# Patient Record
Sex: Female | Born: 1980 | Race: Black or African American | Hispanic: No | Marital: Married | Smoking: Never smoker
Health system: Southern US, Community
[De-identification: ages and names within clinical notes are randomized; demographics above are authoritative.]

## PROBLEM LIST (undated history)

## (undated) DIAGNOSIS — T300 Burn of unspecified body region, unspecified degree: Secondary | ICD-10-CM

## (undated) DIAGNOSIS — T8859XA Other complications of anesthesia, initial encounter: Secondary | ICD-10-CM

## (undated) DIAGNOSIS — O24419 Gestational diabetes mellitus in pregnancy, unspecified control: Secondary | ICD-10-CM

## (undated) DIAGNOSIS — F32A Depression, unspecified: Secondary | ICD-10-CM

## (undated) DIAGNOSIS — E119 Type 2 diabetes mellitus without complications: Secondary | ICD-10-CM

## (undated) DIAGNOSIS — I10 Essential (primary) hypertension: Secondary | ICD-10-CM

## (undated) HISTORY — PX: CERVICAL CERCLAGE: SHX1329

---

## 2017-02-02 ENCOUNTER — Other Ambulatory Visit: Payer: Self-pay

## 2017-02-02 ENCOUNTER — Encounter (HOSPITAL_BASED_OUTPATIENT_CLINIC_OR_DEPARTMENT_OTHER): Payer: Self-pay | Admitting: *Deleted

## 2017-02-02 ENCOUNTER — Emergency Department (HOSPITAL_BASED_OUTPATIENT_CLINIC_OR_DEPARTMENT_OTHER)
Admission: EM | Admit: 2017-02-02 | Discharge: 2017-02-02 | Disposition: A | Discharge status: Home / Self Care | Payer: Managed Care, Other (non HMO) | Attending: Emergency Medicine | Admitting: Emergency Medicine

## 2017-02-02 DIAGNOSIS — H6122 Impacted cerumen, left ear: Secondary | ICD-10-CM | POA: Insufficient documentation

## 2017-02-02 DIAGNOSIS — J069 Acute upper respiratory infection, unspecified: Secondary | ICD-10-CM | POA: Insufficient documentation

## 2017-02-02 DIAGNOSIS — R05 Cough: Secondary | ICD-10-CM | POA: Insufficient documentation

## 2017-02-02 DIAGNOSIS — R531 Weakness: Secondary | ICD-10-CM | POA: Insufficient documentation

## 2017-02-02 DIAGNOSIS — J029 Acute pharyngitis, unspecified: Secondary | ICD-10-CM | POA: Diagnosis present

## 2017-02-02 LAB — RAPID STREP SCREEN (MED CTR MEBANE ONLY): Streptococcus, Group A Screen (Direct): NEGATIVE

## 2017-02-02 MED ORDER — DOCUSATE SODIUM 50 MG/5ML PO LIQD
50.0000 mg | Freq: Once | ORAL | Status: AC
  Administered 2017-02-02: 50 mg via ORAL
  Filled 2017-02-02: qty 10

## 2017-02-02 NOTE — ED Triage Notes (Signed)
Pt has a sore throat and body weakness.

## 2017-02-02 NOTE — ED Provider Notes (Signed)
[facility] EMERGENCY DEPARTMENT Provider Note   CSN: [id number] Arrival date & time: 02/02/17  0827     History   Chief Complaint Chief Complaint  Patient presents with  . Weakness  . Sore Throat    HPI Rita Joseph is a 37 y.o. female.  Patient is a 37 year old female with no significant medical history presenting with 2 weeks of persistent sore throat, cough and drainage.  Patient states symptoms started 2 weeks ago and were much worse than they are now however she continues to have sore throat, minimal cough with occasional sputum and left ear pain.  Patient denies fever, vomiting or abdominal pain.  She denies any shortness of breath.  She does not have a history of asthma and is not a smoker.  She does not have facial pain or significant congestion.   The history is provided by the patient.    History reviewed. No pertinent past medical history.  There are no active problems to display for this patient.   History reviewed. No pertinent surgical history.  OB History    Gravida Para Term Preterm AB Living   3         2   SAB TAB Ectopic Multiple Live Births           3       Home Medications    Prior to Admission medications   Not on File    Family History History reviewed. No pertinent family history.  Social History Social History   Tobacco Use  . Smoking status: Never Smoker  . Smokeless tobacco: Never Used  Substance Use Topics  . Alcohol use: No    Frequency: Never  . Drug use: No     Allergies   Patient has no known allergies.   Review of Systems Review of Systems  All other systems reviewed and are negative.    Physical Exam Updated Vital Signs BP (!) 148/110 (BP Location: Left Arm)   Pulse 86   Temp 98.3 F (36.8 C) (Oral)   Resp 18   Ht 5\' 4"  (1.626 m)   Wt 90.7 kg (200 lb)   SpO2 98%   BMI 34.33 kg/m   Physical Exam  Constitutional: She is oriented to person, place, and time. She appears well-developed  and well-nourished. No distress.  HENT:  Head: Normocephalic and atraumatic.  Right Ear: Tympanic membrane normal.  Left Ear: Tympanic membrane normal.  Mouth/Throat: Oropharynx is clear and moist and mucous membranes are normal. No uvula swelling. No oropharyngeal exudate or posterior oropharyngeal edema. No tonsillar exudate.  Initial left ear was impacted by cerumen but once removed TM is clear.  Minimal petechia over the palate  Eyes: Conjunctivae and EOM are normal. Pupils are equal, round, and reactive to light.  Neck: Normal range of motion. Neck supple.  Cardiovascular: Normal rate, regular rhythm and intact distal pulses.  No murmur heard. Pulmonary/Chest: Effort normal and breath sounds normal. No respiratory distress. She has no wheezes. She has no rales.  Musculoskeletal: Normal range of motion. She exhibits no edema or tenderness.  Neurological: She is alert and oriented to person, place, and time.  Skin: Skin is warm and dry. No rash noted. No erythema.  Psychiatric: She has a normal mood and affect. Her behavior is normal.  Nursing note and vitals reviewed.    ED Treatments / Results  Labs (all labs ordered are listed, but only abnormal results are displayed) Labs Reviewed  RAPID STREP SCREEN (  NOT AT [facility])  CULTURE, GROUP A STREP [facility])    EKG  EKG Interpretation None       Radiology No results found.  Procedures Procedures (including critical care time)  Medications Ordered in ED Medications  docusate (COLACE) 50 MG/5ML liquid 50 mg (50 mg Oral Given 02/02/17 0917)     Initial Impression / Assessment and Plan / ED Course  I have reviewed the triage vital signs and the nursing notes.  Pertinent labs & imaging results that were available during my care of the patient were reviewed by me and considered in my medical decision making (see chart for details).     Patient with persistent URI symptoms which are most likely viral in nature.  Patient displays  no signs concerning for pharyngitis at this time.  Vital signs are reassuring and patient is well-appearing.  Lungs are clear without concern for pneumonia.  Patient reassured and discharged home.  Recommended increasing fluids and rest.  Final Clinical Impressions(s) / ED Diagnoses   Final diagnoses:  Viral upper respiratory tract infection    ED Discharge Orders    None       Gwyneth SproutPlunkett, Anvith Mauriello, MD 02/02/17 1032

## 2017-02-04 LAB — CULTURE, GROUP A STREP (THRC)

## 2018-09-23 ENCOUNTER — Emergency Department (HOSPITAL_BASED_OUTPATIENT_CLINIC_OR_DEPARTMENT_OTHER)
Admission: EM | Admit: 2018-09-23 | Discharge: 2018-09-23 | Disposition: A | Discharge status: Home / Self Care | Payer: Self-pay | Attending: Emergency Medicine | Admitting: Emergency Medicine

## 2018-09-23 ENCOUNTER — Other Ambulatory Visit: Payer: Self-pay

## 2018-09-23 ENCOUNTER — Emergency Department (HOSPITAL_BASED_OUTPATIENT_CLINIC_OR_DEPARTMENT_OTHER): Payer: Self-pay

## 2018-09-23 ENCOUNTER — Encounter (HOSPITAL_BASED_OUTPATIENT_CLINIC_OR_DEPARTMENT_OTHER): Payer: Self-pay | Admitting: *Deleted

## 2018-09-23 DIAGNOSIS — I10 Essential (primary) hypertension: Secondary | ICD-10-CM | POA: Insufficient documentation

## 2018-09-23 DIAGNOSIS — R519 Headache, unspecified: Secondary | ICD-10-CM

## 2018-09-23 DIAGNOSIS — R51 Headache: Secondary | ICD-10-CM | POA: Insufficient documentation

## 2018-09-23 LAB — CBC WITH DIFFERENTIAL/PLATELET
Abs Immature Granulocytes: 0.01 10*3/uL (ref 0.00–0.07)
Basophils Absolute: 0.1 10*3/uL (ref 0.0–0.1)
Basophils Relative: 1 %
Eosinophils Absolute: 0.2 10*3/uL (ref 0.0–0.5)
Eosinophils Relative: 3 %
HCT: 43.2 % (ref 36.0–46.0)
Hemoglobin: 14.2 g/dL (ref 12.0–15.0)
Immature Granulocytes: 0 %
Lymphocytes Relative: 32 %
Lymphs Abs: 2.4 10*3/uL (ref 0.7–4.0)
MCH: 30.6 pg (ref 26.0–34.0)
MCHC: 32.9 g/dL (ref 30.0–36.0)
MCV: 93.1 fL (ref 80.0–100.0)
Monocytes Absolute: 0.7 10*3/uL (ref 0.1–1.0)
Monocytes Relative: 9 %
Neutro Abs: 4.4 10*3/uL (ref 1.7–7.7)
Neutrophils Relative %: 55 %
Platelets: 277 10*3/uL (ref 150–400)
RBC: 4.64 MIL/uL (ref 3.87–5.11)
RDW: 12.4 % (ref 11.5–15.5)
WBC: 7.7 10*3/uL (ref 4.0–10.5)
nRBC: 0 % (ref 0.0–0.2)

## 2018-09-23 LAB — BASIC METABOLIC PANEL
Anion gap: 9 (ref 5–15)
BUN: 9 mg/dL (ref 6–20)
CO2: 25 mmol/L (ref 22–32)
Calcium: 9.3 mg/dL (ref 8.9–10.3)
Chloride: 102 mmol/L (ref 98–111)
Creatinine, Ser: 0.75 mg/dL (ref 0.44–1.00)
GFR calc Af Amer: >60 mL/min (ref >60)
GFR calc non Af Amer: >60 mL/min (ref >60)
Glucose, Bld: 103 mg/dL — ABNORMAL HIGH (ref 70–99)
Potassium: 3.8 mmol/L (ref 3.5–5.1)
Sodium: 136 mmol/L (ref 135–145)

## 2018-09-23 LAB — HCG, SERUM, QUALITATIVE: Preg, Serum: NEGATIVE

## 2018-09-23 MED ORDER — METOCLOPRAMIDE HCL 5 MG/ML IJ SOLN: 10.0000 mg | Freq: Once | INTRAMUSCULAR | Status: DC

## 2018-09-23 MED ORDER — ACETAMINOPHEN 325 MG PO TABS
650.0000 mg | ORAL_TABLET | Freq: Once | ORAL | Status: DC
Start: 2018-09-23 — End: 2018-09-23

## 2018-09-23 MED ORDER — HYDROCHLOROTHIAZIDE 12.5 MG PO TABS: 12.5000 mg | ORAL_TABLET | Freq: Every day | ORAL | 0 refills | Status: DC

## 2018-09-23 MED ORDER — ACETAMINOPHEN 500 MG PO TABS
1000.0000 mg | ORAL_TABLET | Freq: Once | ORAL | Status: AC
  Administered 2018-09-23: 1000 mg via ORAL
  Filled 2018-09-23: qty 2

## 2018-09-23 MED ORDER — SODIUM CHLORIDE 0.9 % IV BOLUS
250.0000 mL | Freq: Once | INTRAVENOUS | Status: AC
  Administered 2018-09-23: 22:00:00 250 mL via INTRAVENOUS

## 2018-09-23 NOTE — ED Provider Notes (Signed)
[facility] EMERGENCY DEPARTMENT Provider Note   CSN: [id number] Arrival date & time: 09/23/18  2006     History   Chief Complaint No chief complaint on file.   HPI Rita Joseph is a 38 y.o. female with no known PMH presents to the ER for evaluation of headache.  Onset for at least 6 months.  Initially attributed it to being stressed, being busy from her children.  She alternates ibuprofen and Tylenol with some temporary relief in her headache.  Initially was not concerned about the headache but 2 weeks ago went on vacation with her family august 9-15, and during that time he noticed that headache was daily and persistent and worse.  Her father has history of hypertension and checked her blood pressure and it was 220/120.  She continued to check her blood pressure that week, every day and her blood pressure did not change.  She has not checked her blood pressure since but on arrival it is 170/110.  Her headache today began gradually and slowly worsened.  It is described as "not that bad", generalized, throbbing.  No interventions for this today.  She denies any associated vision changes or loss, difficulty with swallowing or speech or balance, unilateral weakness or loss of sensation.  She has no associated chest pain, shortness of breath, orthopnea, severe abdominal or back pain.  Reports for a long time she has had intermittent bilateral, symmetric lower extremity swelling that is usually better in the morning and worsens at the end of the day.  She does stand for prolonged period of time at work.  She currently does not think her legs are swollen.  No fever, neck pain or stiffness     HPI  History reviewed. No pertinent past medical history.  There are no active problems to display for this patient.   History reviewed. No pertinent surgical history.   OB History    Gravida  3   Para      Term      Preterm      AB      Living  2     SAB      TAB      Ectopic      Multiple      Live Births  3            Home Medications    Prior to Admission medications   Medication Sig Start Date End Date Taking? Authorizing Provider  hydrochlorothiazide (HYDRODIURIL) 12.5 MG tablet Take 1 tablet (12.5 mg total) by mouth daily. 09/23/18 10/23/18  Liberty Handy, PA-C    Family History No family history on file.  Social History Social History   Tobacco Use  . Smoking status: Never Smoker  . Smokeless tobacco: Never Used  Substance Use Topics  . Alcohol use: No    Frequency: Never  . Drug use: No     Allergies   Patient has no known allergies.   Review of Systems Review of Systems  Neurological: Positive for headaches.  All other systems reviewed and are negative.    Physical Exam Updated Vital Signs BP (!) 158/105   Pulse 64   Temp 98.7 F (37.1 C) (Oral)   Resp 18   Ht 5\' 4"  (1.626 m)   Wt 89.8 kg   LMP 09/09/2018   SpO2 100%   BMI 33.99 kg/m   Physical Exam Vitals signs and nursing note reviewed.  Constitutional:      General:  She is not in acute distress.    Appearance: She is well-developed.     Comments: NAD.  HENT:     Head: Normocephalic and atraumatic.     Comments: No temporal tenderness    Right Ear: External ear normal.     Left Ear: External ear normal.     Nose: Nose normal.  Eyes:     General: No scleral icterus.    Conjunctiva/sclera: Conjunctivae normal.  Neck:     Musculoskeletal: Normal range of motion and neck supple.  Cardiovascular:     Rate and Rhythm: Normal rate and regular rhythm.     Heart sounds: Normal heart sounds.     Comments: No 1+ DP and radial pulses bilaterally.  No lower extremity edema.  No calf tenderness. Pulmonary:     Effort: Pulmonary effort is normal.     Breath sounds: Normal breath sounds.  Musculoskeletal: Normal range of motion.        General: No deformity.  Skin:    General: Skin is warm and dry.     Capillary Refill: Capillary refill takes  less than 2 seconds.  Neurological:     Mental Status: She is alert and oriented to person, place, and time.     Comments:  Alert and oriented to self, place, time and event.  Speech is fluent without dysarthria or dysphasia. Strength 5/5 with hand grip and ankle F/E.   Sensation to light touch intact in face, hands and feet. Normal gait/sits on side of the bed without truncal sway No pronator drift. No leg drop. Normal finger-to-nose and feet tapping.  CN I not tested CN II grossly intact visual fields bilaterally. Unable to visualize posterior eye. CN III, IV, VI PEERL and EOMs intact bilaterally CN V light touch intact in all 3 divisions of trigeminal nerve CN VII facial movements symmetric CN VIII not tested CN IX, X no uvula deviation, symmetric rise of soft palate  CN XI 5/5 SCM and trapezius strength bilaterally  CN XII Midline tongue protrusion, symmetric L/R movements  Psychiatric:        Behavior: Behavior normal.        Thought Content: Thought content normal.        Judgment: Judgment normal.      ED Treatments / Results  Labs (all labs ordered are listed, but only abnormal results are displayed) Labs Reviewed  BASIC METABOLIC PANEL - Abnormal; Notable for the following components:      Result Value   Glucose, Bld 103 (*)    All other components within normal limits  CBC WITH DIFFERENTIAL/PLATELET  HCG, SERUM, QUALITATIVE    EKG None  Radiology Ct Head Wo Contrast  Result Date: 09/23/2018 CLINICAL DATA:  Headache for 1 month EXAM: CT HEAD WITHOUT CONTRAST TECHNIQUE: Contiguous axial images were obtained from the base of the skull through the vertex without intravenous contrast. COMPARISON:  None. FINDINGS: Brain: No evidence of acute infarction, hemorrhage, hydrocephalus, extra-axial collection or mass lesion/mass effect. Vascular: No hyperdense vessel or unexpected calcification. Skull: Normal. Negative for fracture or focal lesion. Sinuses/Orbits: No acute  finding. Other: None IMPRESSION: Negative non contrasted CT appearance of the brain Electronically Signed   By: Donavan Foil M.D.   On: 09/23/2018 21:47    Procedures Procedures (including critical care time)  Medications Ordered in ED Medications  sodium chloride 0.9 % bolus 250 mL (250 mLs Intravenous New Bag/Given 09/23/18 2159)  acetaminophen (TYLENOL) tablet 1,000 mg (1,000 mg Oral Given  09/23/18 2152)     Initial Impression / Assessment and Plan / ED Course  I have reviewed the triage vital signs and the nursing notes.  Pertinent labs & imaging results that were available during my care of the patient were reviewed by me and considered in my medical decision making (see chart for details).  38 year old with no known PMH presents for evaluation of headache in setting of elevated blood pressure.  No previously diagnosed hypertension.  No antihypertensives.  She has strong family history of this.  Her headache has been present for several months.  At home her BPs are ranging from the 200s/100s.  She arrives hypertensive in the 170s over 110s.    Exam is otherwise reassuring.  There is no neuro pulse deficits.  ER work-up reviewed by me.  Essentially normal.  Creatinine is normal.  Head CT is negative for intracranial hemorrhage.  Patient was given Tylenol, IV fluids and her BP improved to 158/105.  Her headache has improved.  Given benign exam, ER work-up I do not think there is further indication for aggressive BP control here for further emergent work-up, admission.  We will start her on antihypertensive.  Recommended PCP follow-up and logging of her BPs to determine if she needs med changes.  Return precautions discussed.  Patient is comfortable with this plan.  Final Clinical Impressions(s) / ED Diagnoses   Final diagnoses:  Elevated blood pressure reading with diagnosis of hypertension  Recurrent headache    ED Discharge Orders         Ordered    hydrochlorothiazide  (HYDRODIURIL) 12.5 MG tablet  Daily     09/23/18 2217           Jerrell MylarGibbons, Khyri Hinzman J, PA-C 09/23/18 2217    Tegeler, Canary Brimhristopher J, MD 09/24/18 [phone]

## 2018-09-23 NOTE — ED Notes (Signed)
Pt. Reports she has had a headache with high b/p and she is here because she does not want to have heart problems or a stroke from this.

## 2018-09-23 NOTE — ED Notes (Signed)
ED Provider at bedside. 

## 2018-09-23 NOTE — Discharge Instructions (Addendum)
°  You were seen in the ER for headache and elevated blood pressure.  Head CT today was normal.  Your lab work was normal.  Your blood pressure improved.  I suspect headaches are from elevated blood pressure.  We will start you on a low-dose blood pressure medicine called hydrochlorothiazide.  This medicine can cause increased urination which will help with fluid buildup.  Take this medication every day.  You can monitor and log your blood pressure readings over the next several days to keep track of blood pressure control.  Follow-up with primary care doctor in 7 to 10 days for reevaluation of your symptoms.  You may need medication adjustment.  Return to the ER for severe sudden headache, vision changes or loss, chest pain, shortness of breath, leg swelling, stroke symptoms.

## 2018-09-23 NOTE — ED Triage Notes (Signed)
Headache for 2 months. She checked her BP 10 days ago and it was 200/118. No hx of HTN.

## 2019-02-05 ENCOUNTER — Other Ambulatory Visit: Payer: Self-pay

## 2019-02-06 ENCOUNTER — Telehealth (INDEPENDENT_AMBULATORY_CARE_PROVIDER_SITE_OTHER): Payer: Self-pay | Admitting: Family Medicine

## 2019-02-06 ENCOUNTER — Encounter: Payer: Self-pay | Admitting: Family Medicine

## 2019-02-06 ENCOUNTER — Ambulatory Visit: Payer: Self-pay | Admitting: Family Medicine

## 2019-02-06 VITALS — BP 137/86 | HR 95 | Ht 64.0 in | Wt 192.0 lb

## 2019-02-06 DIAGNOSIS — I10 Essential (primary) hypertension: Secondary | ICD-10-CM | POA: Insufficient documentation

## 2019-02-06 DIAGNOSIS — Z7689 Persons encountering health services in other specified circumstances: Secondary | ICD-10-CM

## 2019-02-06 MED ORDER — HYDROCHLOROTHIAZIDE 12.5 MG PO TABS: 12.5000 mg | ORAL_TABLET | Freq: Every day | ORAL | 3 refills | Status: DC

## 2019-02-06 NOTE — Progress Notes (Signed)
Virtual Visit via Video Note  I connected with Rita Joseph on 02/06/19 at  9:00 AM EST by a video enabled telemedicine application and verified that I am speaking with the correct person using two identifiers. Location patient: home Location provider: work  Persons participating in the virtual visit: patient, provider  I discussed the limitations of evaluation and management by telemedicine and the availability of in person appointments. The patient expressed understanding and agreed to proceed.  Chief Complaint  Patient presents with  . Establish Care    Pt c/o right foot swelling x 2 weeks,  rt hand tingling and numb when she wakes up.  Pt has not been taking her medication for the past 3 weeks due to insurance issues.  . Hypertension    Pt check her bp once a day.  02/05/19;137/86, HR 102, 02/04/19; 154/96, HR 100, 02/03/19;147/64, HR 103.    HPI: Rita Joseph is a 39 y.o. female new to our office to establish care. She has a h/o HTN and was previously Rx'd HCTZ 12.5mg  daily when seen in the ER in 08/2018 for headache and elevated BP. She has not been taking her HCTZ x 3-4 weeks due to insurance. Home BP logs 147/64, 154/96, 137/86. HR 100-103. These values are off medication. While on medication - 110/74, 114/78, 133/95, 120/78, 126/88, 124/90, 138/98, 126/79. Pt has been trying to drink more water Pt complains of swelling in her Rt foot to her ankle. Not getting worse.  No erythema, warmth.   Fam h/o HTN - father  No past medical history on file.  No past surgical history on file.  No family history on file.  Social History   Tobacco Use  . Smoking status: Never Smoker  . Smokeless tobacco: Never Used  Substance Use Topics  . Alcohol use: No  . Drug use: No     Current Outpatient Medications:  .  hydrochlorothiazide (HYDRODIURIL) 12.5 MG tablet, Take 1 tablet (12.5 mg total) by mouth daily., Disp: 30 tablet, Rfl: 0  No Known Allergies    ROS: See pertinent  positives and negatives per HPI.   EXAM:  VITALS per patient if applicable: BP 062/37 (BP Location: Left Wrist, Patient Position: Sitting, Cuff Size: Small)   Pulse 95   Ht 5\' 4"  (1.626 m)   Wt 192 lb (87.1 kg)   BMI 32.96 kg/m    GENERAL: alert, oriented, appears well and in no acute distress  HEENT: atraumatic, conjunctiva clear, no obvious abnormalities on inspection of external nose and ears  NECK: normal movements of the head and neck  LUNGS: on inspection no signs of respiratory distress, breathing rate appears normal, no obvious gross SOB, gasping or wheezing, no conversational dyspnea  CV: no obvious cyanosis  MS: moves all visible extremities without noticeable abnormality  PSYCH/NEURO: pleasant and cooperative, no obvious depression or anxiety, speech and thought processing grossly intact   Lab Results  Component Value Date   NA 136 09/23/2018   K 3.8 09/23/2018   CL 102 09/23/2018   CO2 25 09/23/2018   Lab Results  Component Value Date   BUN 9 09/23/2018   Lab Results  Component Value Date   CREATININE 0.75 09/23/2018   Lab Results  Component Value Date   WBC 7.7 09/23/2018   HGB 14.2 09/23/2018   HCT 43.2 09/23/2018   MCV 93.1 09/23/2018   PLT 277 09/23/2018     ASSESSMENT AND PLAN: 1. Hypertension, unspecified type - dx in 08/2018, controlled on  HCTZ 12.5mg  daily. Pt has been on med x 4 wks and home BP readings elevated and swelling in fett Refill: - hydrochlorothiazide (HYDRODIURIL) 12.5 MG tablet; Take 1 tablet (12.5 mg total) by mouth daily.  Dispense: 90 tablet; Refill: 3 - check BP 4-5x/wk x 2 wks and send mychart message with these readings - DASH diet - info included in AVS  2. Encounter to establish care with new doctor - due for CPE, fasting labs, PAP - pt will call office to schedule   I discussed the assessment and treatment plan with the patient. The patient was provided an opportunity to ask questions and all were answered. The  patient agreed with the plan and demonstrated an understanding of the instructions.   The patient was advised to call back or seek an in-person evaluation if the symptoms worsen or if the condition fails to improve as anticipated.   Luana Shu, DO

## 2019-02-06 NOTE — Patient Instructions (Addendum)
DASH Eating Plan DASH stands for "Dietary Approaches to Stop Hypertension." The DASH eating plan is a healthy eating plan that has been shown to reduce high blood pressure (hypertension). It may also reduce your risk for type 2 diabetes, heart disease, and stroke. The DASH eating plan may also help with weight loss. What are tips for following this plan?  General guidelines  Avoid eating more than 2,300 mg (milligrams) of salt (sodium) a day. If you have hypertension, you may need to reduce your sodium intake to 1,500 mg a day.  Limit alcohol intake to no more than 1 drink a day for nonpregnant women and 2 drinks a day for men. One drink equals 12 oz of beer, 5 oz of wine, or 1 oz of hard liquor.  Work with your health care provider to maintain a healthy body weight or to lose weight. Ask what an ideal weight is for you.  Get at least 30 minutes of exercise that causes your heart to beat faster (aerobic exercise) most days of the week. Activities may include walking, swimming, or biking.  Work with your health care provider or diet and nutrition specialist (dietitian) to adjust your eating plan to your individual calorie needs. Reading food labels   Check food labels for the amount of sodium per serving. Choose foods with less than 5 percent of the Daily Value of sodium. Generally, foods with less than 300 mg of sodium per serving fit into this eating plan.  To find whole grains, look for the word "whole" as the first word in the ingredient list. Shopping  Buy products labeled as "low-sodium" or "no salt added."  Buy fresh foods. Avoid canned foods and premade or frozen meals. Cooking  Avoid adding salt when cooking. Use salt-free seasonings or herbs instead of table salt or sea salt. Check with your health care provider or pharmacist before using salt substitutes.  Do not fry foods. Cook foods using healthy methods such as baking, boiling, grilling, and broiling instead.  Cook with  heart-healthy oils, such as olive, canola, soybean, or sunflower oil. Meal planning  Eat a balanced diet that includes: ? 5 or more servings of fruits and vegetables each day. At each meal, try to fill half of your plate with fruits and vegetables. ? Up to 6-8 servings of whole grains each day. ? Less than 6 oz of lean meat, poultry, or fish each day. A 3-oz serving of meat is about the same size as a deck of cards. One egg equals 1 oz. ? 2 servings of low-fat dairy each day. ? A serving of nuts, seeds, or beans 5 times each week. ? Heart-healthy fats. Healthy fats called Omega-3 fatty acids are found in foods such as flaxseeds and coldwater fish, like sardines, salmon, and mackerel.  Limit how much you eat of the following: ? Canned or prepackaged foods. ? Food that is high in trans fat, such as fried foods. ? Food that is high in saturated fat, such as fatty meat. ? Sweets, desserts, sugary drinks, and other foods with added sugar. ? Full-fat dairy products.  Do not salt foods before eating.  Try to eat at least 2 vegetarian meals each week.  Eat more home-cooked food and less restaurant, buffet, and fast food.  When eating at a restaurant, ask that your food be prepared with less salt or no salt, if possible. What foods are recommended? The items listed may not be a complete list. Talk with your dietitian about   what dietary choices are best for you. Grains Whole-grain or whole-wheat bread. Whole-grain or whole-wheat pasta. Brown rice. Oatmeal. Quinoa. Bulgur. Whole-grain and low-sodium cereals. Pita bread. Low-fat, low-sodium crackers. Whole-wheat flour tortillas. Vegetables Fresh or frozen vegetables (raw, steamed, roasted, or grilled). Low-sodium or reduced-sodium tomato and vegetable juice. Low-sodium or reduced-sodium tomato sauce and tomato paste. Low-sodium or reduced-sodium canned vegetables. Fruits All fresh, dried, or frozen fruit. Canned fruit in natural juice (without  added sugar). Meat and other protein foods Skinless chicken or turkey. Ground chicken or turkey. Pork with fat trimmed off. Fish and seafood. Egg whites. Dried beans, peas, or lentils. Unsalted nuts, nut butters, and seeds. Unsalted canned beans. Lean cuts of beef with fat trimmed off. Low-sodium, lean deli meat. Dairy Low-fat (1%) or fat-free (skim) milk. Fat-free, low-fat, or reduced-fat cheeses. Nonfat, low-sodium ricotta or cottage cheese. Low-fat or nonfat yogurt. Low-fat, low-sodium cheese. Fats and oils Soft margarine without trans fats. Vegetable oil. Low-fat, reduced-fat, or light mayonnaise and salad dressings (reduced-sodium). Canola, safflower, olive, soybean, and sunflower oils. Avocado. Seasoning and other foods Herbs. Spices. Seasoning mixes without salt. Unsalted popcorn and pretzels. Fat-free sweets. What foods are not recommended? The items listed may not be a complete list. Talk with your dietitian about what dietary choices are best for you. Grains Baked goods made with fat, such as croissants, muffins, or some breads. Dry pasta or rice meal packs. Vegetables Creamed or fried vegetables. Vegetables in a cheese sauce. Regular canned vegetables (not low-sodium or reduced-sodium). Regular canned tomato sauce and paste (not low-sodium or reduced-sodium). Regular tomato and vegetable juice (not low-sodium or reduced-sodium). Pickles. Olives. Fruits Canned fruit in a light or heavy syrup. Fried fruit. Fruit in cream or butter sauce. Meat and other protein foods Fatty cuts of meat. Ribs. Fried meat. Bacon. Sausage. Bologna and other processed lunch meats. Salami. Fatback. Hotdogs. Bratwurst. Salted nuts and seeds. Canned beans with added salt. Canned or smoked fish. Whole eggs or egg yolks. Chicken or turkey with skin. Dairy Whole or 2% milk, cream, and half-and-half. Whole or full-fat cream cheese. Whole-fat or sweetened yogurt. Full-fat cheese. Nondairy creamers. Whipped toppings.  Processed cheese and cheese spreads. Fats and oils Butter. Stick margarine. Lard. Shortening. Ghee. Bacon fat. Tropical oils, such as coconut, palm kernel, or palm oil. Seasoning and other foods Salted popcorn and pretzels. Onion salt, garlic salt, seasoned salt, table salt, and sea salt. Worcestershire sauce. Tartar sauce. Barbecue sauce. Teriyaki sauce. Soy sauce, including reduced-sodium. Steak sauce. Canned and packaged gravies. Fish sauce. Oyster sauce. Cocktail sauce. Horseradish that you find on the shelf. Ketchup. Mustard. Meat flavorings and tenderizers. Bouillon cubes. Hot sauce and Tabasco sauce. Premade or packaged marinades. Premade or packaged taco seasonings. Relishes. Regular salad dressings. Where to find more information:  National Heart, Lung, and Blood Institute: www.nhlbi.nih.gov  American Heart Association: www.heart.org Summary  The DASH eating plan is a healthy eating plan that has been shown to reduce high blood pressure (hypertension). It may also reduce your risk for type 2 diabetes, heart disease, and stroke.  With the DASH eating plan, you should limit salt (sodium) intake to 2,300 mg a day. If you have hypertension, you may need to reduce your sodium intake to 1,500 mg a day.  When on the DASH eating plan, aim to eat more fresh fruits and vegetables, whole grains, lean proteins, low-fat dairy, and heart-healthy fats.  Work with your health care provider or diet and nutrition specialist (dietitian) to adjust your eating plan to your   individual calorie needs. This information is not intended to replace advice given to you by your health care provider. Make sure you discuss any questions you have with your health care provider. Document Revised: 12/28/2016 Document Reviewed: 01/09/2016 Elsevier Patient Education  2020 Elsevier Inc.  

## 2019-02-12 ENCOUNTER — Ambulatory Visit: Payer: Self-pay | Admitting: Family Medicine

## 2019-03-03 ENCOUNTER — Other Ambulatory Visit: Payer: Self-pay

## 2019-03-03 NOTE — Patient Instructions (Addendum)
Health Maintenance Due  Topic Date Due  . HIV Screening  07/07/1996  . TETANUS/TDAP  07/07/2000  . PAP SMEAR-Modifier  07/08/2002  . INFLUENZA VACCINE  08/30/2018    Depression screen PHQ 2/9 02/06/2019  Decreased Interest 0  Down, Depressed, Hopeless 0  PHQ - 2 Score 0   Increase HCTZ to 25mg  (12.5mg  tabs x 2).  [facility]  [email or web address] [address], [place] [address] P: [phone] F: [phone] [email or web address]  [facility] [email or web address] [address], [place], [place] [address] fax: [phone] [email or web address]   Health Maintenance, Female Adopting a healthy lifestyle and getting preventive care are important in promoting health and wellness. Ask your health care provider about:  The right schedule for you to have regular tests and exams.  Things you can do on your own to prevent diseases and keep yourself healthy. What should I know about diet, weight, and exercise? Eat a healthy diet   Eat a diet that includes plenty of vegetables, fruits, low-fat dairy products, and lean protein.  Do not eat a lot of foods that are high in solid fats, added sugars, or sodium. Maintain a healthy weight Body mass index (BMI) is used to identify weight problems. It estimates body fat based on height and weight. Your health care provider can help determine your BMI and help you achieve or maintain a healthy weight. Get regular exercise Get regular exercise. This is one of the most important things you can do for your health. Most adults should:  Exercise for at least 150 minutes each week. The exercise should increase your heart rate and make you sweat (moderate-intensity exercise).  Do strengthening exercises at least twice a week. This is in addition to the moderate-intensity exercise.  Spend less time sitting. Even light physical activity can be beneficial. Watch  cholesterol and blood lipids Have your blood tested for lipids and cholesterol at 38 years of age, then have this test every 5 years. Have your cholesterol levels checked more often if:  Your lipid or cholesterol levels are high.  You are older than 38 years of age.  You are at high risk for heart disease. What should I know about cancer screening? Depending on your health history and family history, you may need to have cancer screening at various ages. This may include screening for:  Breast cancer.  Cervical cancer.  Colorectal cancer.  Skin cancer.  Lung cancer. What should I know about heart disease, diabetes, and high blood pressure? Blood pressure and heart disease  High blood pressure causes heart disease and increases the risk of stroke. This is more likely to develop in people who have high blood pressure readings, are of African descent, or are overweight.  Have your blood pressure checked: ? Every 3-5 years if you are 70-5 years of age. ? Every year if you are 11 years old or older. Diabetes Have regular diabetes screenings. This checks your fasting blood sugar level. Have the screening done:  Once every three years after age 38 if you are at a normal weight and have a low risk for diabetes.  More often and at a younger age if you are overweight or have a high risk for diabetes. What should I know about preventing infection? Hepatitis B If you have a higher risk for hepatitis B, you should be screened for this virus. Talk with your health care provider to find out if you are at risk for hepatitis B  infection. Hepatitis C Testing is recommended for:  Everyone born from [birth year] through 1964.  Anyone with known risk factors for hepatitis C. Sexually transmitted infections (STIs)  Get screened for STIs, including gonorrhea and chlamydia, if: ? You are sexually active and are younger than 38 years of age. ? You are older than 38 years of age and your health care  provider tells you that you are at risk for this type of infection. ? Your sexual activity has changed since you were last screened, and you are at increased risk for chlamydia or gonorrhea. Ask your health care provider if you are at risk.  Ask your health care provider about whether you are at high risk for HIV. Your health care provider may recommend a prescription medicine to help prevent HIV infection. If you choose to take medicine to prevent HIV, you should first get tested for HIV. You should then be tested every 3 months for as long as you are taking the medicine. Pregnancy  If you are about to stop having your period (premenopausal) and you may become pregnant, seek counseling before you get pregnant.  Take 400 to 800 micrograms (mcg) of folic acid every day if you become pregnant.  Ask for birth control (contraception) if you want to prevent pregnancy. Osteoporosis and menopause Osteoporosis is a disease in which the bones lose minerals and strength with aging. This can result in bone fractures. If you are 36 years old or older, or if you are at risk for osteoporosis and fractures, ask your health care provider if you should:  Be screened for bone loss.  Take a calcium or vitamin D supplement to lower your risk of fractures.  Be given hormone replacement therapy (HRT) to treat symptoms of menopause. Follow these instructions at home: Lifestyle  Do not use any products that contain nicotine or tobacco, such as cigarettes, e-cigarettes, and chewing tobacco. If you need help quitting, ask your health care provider.  Do not use street drugs.  Do not share needles.  Ask your health care provider for help if you need support or information about quitting drugs. Alcohol use  Do not drink alcohol if: ? Your health care provider tells you not to drink. ? You are pregnant, may be pregnant, or are planning to become pregnant.  If you drink alcohol: ? Limit how much you use to 0-1  drink a day. ? Limit intake if you are breastfeeding.  Be aware of how much alcohol is in your drink. In the [place] one drink equals one 12 oz bottle of beer (355 mL), one 5 oz glass of wine (148 mL), or one 1 oz glass of hard liquor (44 mL). General instructions  Schedule regular health, dental, and eye exams.  Stay current with your vaccines.  Tell your health care provider if: ? You often feel depressed. ? You have ever been abused or do not feel safe at home. Summary  Adopting a healthy lifestyle and getting preventive care are important in promoting health and wellness.  Follow your health care provider's instructions about healthy diet, exercising, and getting tested or screened for diseases.  Follow your health care provider's instructions on monitoring your cholesterol and blood pressure. This information is not intended to replace advice given to you by your health care provider. Make sure you discuss any questions you have with your health care provider. Document Revised: 01/08/2018 Document Reviewed: 01/08/2018 Elsevier Patient Education  2020 [employer].

## 2019-03-04 ENCOUNTER — Other Ambulatory Visit (HOSPITAL_COMMUNITY)
Admission: RE | Admit: 2019-03-04 | Discharge: 2019-03-04 | Disposition: A | Discharge status: Home / Self Care | Payer: Managed Care, Other (non HMO) | Source: Ambulatory Visit | Attending: Family Medicine | Admitting: Family Medicine

## 2019-03-04 ENCOUNTER — Ambulatory Visit (INDEPENDENT_AMBULATORY_CARE_PROVIDER_SITE_OTHER): Payer: Managed Care, Other (non HMO) | Admitting: Family Medicine

## 2019-03-04 ENCOUNTER — Encounter: Payer: Self-pay | Admitting: Family Medicine

## 2019-03-04 VITALS — BP 140/90 | HR 75 | Temp 97.4°F | Ht 62.5 in | Wt 189.6 lb

## 2019-03-04 DIAGNOSIS — R600 Localized edema: Secondary | ICD-10-CM

## 2019-03-04 DIAGNOSIS — Z2821 Immunization not carried out because of patient refusal: Secondary | ICD-10-CM

## 2019-03-04 DIAGNOSIS — L309 Dermatitis, unspecified: Secondary | ICD-10-CM | POA: Diagnosis not present

## 2019-03-04 DIAGNOSIS — Z6834 Body mass index (BMI) 34.0-34.9, adult: Secondary | ICD-10-CM | POA: Diagnosis not present

## 2019-03-04 DIAGNOSIS — Z0001 Encounter for general adult medical examination with abnormal findings: Secondary | ICD-10-CM | POA: Diagnosis not present

## 2019-03-04 DIAGNOSIS — I10 Essential (primary) hypertension: Secondary | ICD-10-CM

## 2019-03-04 DIAGNOSIS — Z124 Encounter for screening for malignant neoplasm of cervix: Secondary | ICD-10-CM

## 2019-03-04 DIAGNOSIS — Z0000 Encounter for general adult medical examination without abnormal findings: Secondary | ICD-10-CM

## 2019-03-04 LAB — LIPID PANEL
Cholesterol: 173 mg/dL (ref 0–200)
HDL: 50.6 mg/dL (ref >39.00)
LDL Cholesterol: 109 mg/dL — ABNORMAL HIGH (ref 0–99)
NonHDL: 122.33
Total CHOL/HDL Ratio: 3
Triglycerides: 67 mg/dL (ref 0.0–149.0)
VLDL: 13.4 mg/dL (ref 0.0–40.0)

## 2019-03-04 LAB — BASIC METABOLIC PANEL
BUN: 9 mg/dL (ref 6–23)
CO2: 28 mEq/L (ref 19–32)
Calcium: 9.4 mg/dL (ref 8.4–10.5)
Chloride: 103 mEq/L (ref 96–112)
Creatinine, Ser: 0.83 mg/dL (ref 0.40–1.20)
GFR: 93.27 mL/min (ref >60.00)
Glucose, Bld: 81 mg/dL (ref 70–99)
Potassium: 3.7 mEq/L (ref 3.5–5.1)
Sodium: 138 mEq/L (ref 135–145)

## 2019-03-04 LAB — TSH: TSH: 1.06 u[IU]/mL (ref 0.35–4.50)

## 2019-03-04 LAB — CBC
HCT: 42.8 % (ref 36.0–46.0)
Hemoglobin: 14.7 g/dL (ref 12.0–15.0)
MCHC: 34.4 g/dL (ref 30.0–36.0)
MCV: 95.9 fl (ref 78.0–100.0)
Platelets: 243 10*3/uL (ref 150.0–400.0)
RBC: 4.47 Mil/uL (ref 3.87–5.11)
RDW: 12.9 % (ref 11.5–15.5)
WBC: 6.5 10*3/uL (ref 4.0–10.5)

## 2019-03-04 LAB — VITAMIN D 25 HYDROXY (VIT D DEFICIENCY, FRACTURES): VITD: 10.09 ng/mL — ABNORMAL LOW (ref 30.00–100.00)

## 2019-03-04 LAB — AST: AST: 19 U/L (ref 0–37)

## 2019-03-04 LAB — HEMOGLOBIN A1C: Hgb A1c MFr Bld: 5.4 % (ref 4.6–6.5)

## 2019-03-04 LAB — ALT: ALT: 26 U/L (ref 0–35)

## 2019-03-04 MED ORDER — HYDROCHLOROTHIAZIDE 25 MG PO TABS: 25.0000 mg | ORAL_TABLET | Freq: Every day | ORAL | 3 refills | Status: DC

## 2019-03-04 MED ORDER — FUROSEMIDE 20 MG PO TABS: 20.0000 mg | ORAL_TABLET | Freq: Every day | ORAL | 1 refills | Status: DC | PRN

## 2019-03-04 MED ORDER — TRIAMCINOLONE ACETONIDE 0.5 % EX OINT: 1.0000 "application " | TOPICAL_OINTMENT | Freq: Two times a day (BID) | CUTANEOUS | 0 refills | Status: DC

## 2019-03-04 NOTE — Progress Notes (Signed)
Rita Joseph is a 39 y.o. female  Chief Complaint  Patient presents with  . Annual Exam    Pt would like dicuss her sugar.  Rt foot pain and swelling.  Pt is fasting for labs today.. Pt would like for her IUD to be looked at to make sure that it is ok. Pt declines the flu shot today.  Pt is due for a Tdap.  Pt will recieve a Pap Smear today.    HPI: Rita Joseph is a 39 y.o. female here for CPE, fasting labs.   She states she has an IUD in place x 4.5 years or so. She is unsure of exactly which IUD (not paraguard) she has and unsure of when it needs to be replaced. She thinks she may have been told 3 years.   Declines flu and Tdap vaccines today.   She has a h/o HTN and currently taking HCTZ 12.5mg  daily. She states she forgot home BP log but BP readings are "never in the green". She recalls 130-140's/90-101.   Rt foot swelling. Worse at end of the day but still swollen in AM. Started after stopping HCTZ. She notes some discoloration on the top of her foot with patchy areas of dry skin.  No pain at rest or when standing or walking. No numbness or tingling. Does not feel cold.  Dry/rough/doscolored skin itches.  Last PAP: due  She has been trying to lose weight since 12/2018.  She was 215lbs and is down to 189lbs. She has cut out beef and pork, using air fryer.  She just started exercising as well.    History reviewed. No pertinent past medical history.  History reviewed. No pertinent surgical history.  Social History   Socioeconomic History  . Marital status: Married    Spouse name: Not on file  . Number of children: Not on file  . Years of education: Not on file  . Highest education level: Not on file  Occupational History  . Not on file  Tobacco Use  . Smoking status: Never Smoker  . Smokeless tobacco: Never Used  Substance and Sexual Activity  . Alcohol use: No  . Drug use: No  . Sexual activity: Yes    Birth control/protection: I.U.D.  Other Topics  Concern  . Not on file  Social History Narrative  . Not on file   Social Determinants of Health   Financial Resource Strain:   . Difficulty of Paying Living Expenses: Not on file  Food Insecurity:   . Worried About Programme researcher, broadcasting/film/video in the Last Year: Not on file  . Ran Out of Food in the Last Year: Not on file  Transportation Needs:   . Lack of Transportation (Medical): Not on file  . Lack of Transportation (Non-Medical): Not on file  Physical Activity:   . Days of Exercise per Week: Not on file  . Minutes of Exercise per Session: Not on file  Stress:   . Feeling of Stress : Not on file  Social Connections:   . Frequency of Communication with Friends and Family: Not on file  . Frequency of Social Gatherings with Friends and Family: Not on file  . Attends Religious Services: Not on file  . Active Member of Clubs or Organizations: Not on file  . Attends Banker Meetings: Not on file  . Marital Status: Not on file  Intimate Partner Violence:   . Fear of Current or Ex-Partner: Not on file  . Emotionally  Abused: Not on file  . Physically Abused: Not on file  . Sexually Abused: Not on file    History reviewed. No pertinent family history.    There is no immunization history on file for this patient.  Outpatient Encounter Medications as of 03/04/2019  Medication Sig  . hydrochlorothiazide (HYDRODIURIL) 12.5 MG tablet Take 1 tablet (12.5 mg total) by mouth daily.   No facility-administered encounter medications on file as of 03/04/2019.     ROS: Gen: no fever, chills  Skin: no rash, itching ENT: no ear pain, ear drainage, nasal congestion, rhinorrhea, sinus pressure, sore throat Eyes: no blurry vision, double vision Resp: no cough, wheeze,SOB Breast: no breast tenderness, no nipple discharge, no breast masses CV: no CP, palpitations, LE edema,  GI: no heartburn, n/v/d/c, abd pain GU: no dysuria, urgency, frequency, hematuria; no vaginal itching, odor,  discharge MSK: no joint pain, myalgias, back pain Neuro: no dizziness, headache, weakness, vertigo Psych: no depression, anxiety, insomnia   No Known Allergies  BP 140/90 (BP Location: Left Arm, Patient Position: Sitting, Cuff Size: Normal)   Pulse 75   Temp (!) 97.4 F (36.3 C) (Temporal)   Ht 5' 2.5" (1.588 m)   Wt 189 lb 9.6 oz (86 kg)   LMP 02/08/2019 Comment: IUD  SpO2 100%   BMI 34.13 kg/m   BP Readings from Last 3 Encounters:  03/04/19 140/90  02/06/19 137/86  09/23/18 (!) 158/105   Wt Readings from Last 3 Encounters:  03/04/19 189 lb 9.6 oz (86 kg)  02/06/19 192 lb (87.1 kg)  09/23/18 198 lb (89.8 kg)    Physical Exam  Constitutional: She is oriented to person, place, and time. She appears well-developed and well-nourished. No distress.  HENT:  Head: Normocephalic and atraumatic.  Right Ear: Tympanic membrane and ear canal normal.  Left Ear: Tympanic membrane and ear canal normal.  Nose: Nose normal.  Mouth/Throat: Oropharynx is clear and moist and mucous membranes are normal.  Eyes: Pupils are equal, round, and reactive to light. Conjunctivae are normal.  Neck: No thyromegaly present.  Cardiovascular: Normal rate, regular rhythm, normal heart sounds and intact distal pulses.  No murmur heard. Pulmonary/Chest: Effort normal and breath sounds normal. No respiratory distress. She has no wheezes. She has no rhonchi. Right breast exhibits no mass, no nipple discharge, no skin change and no tenderness. Left breast exhibits no mass, no nipple discharge, no skin change and no tenderness.  Abdominal: Soft. Bowel sounds are normal. She exhibits no distension and no mass. There is no abdominal tenderness.  Genitourinary:    Vagina and uterus normal.  There is no rash, tenderness or lesion on the right labia. There is no rash, tenderness or lesion on the left labia. Cervix exhibits no motion tenderness, no discharge and no friability. Right adnexum displays no mass, no  tenderness and no fullness. Left adnexum displays no mass, no tenderness and no fullness.    Genitourinary Comments: IUD string visualized   Musculoskeletal:        General: Edema (Rt pedal edema, no TTP, neg homan's sign) present. No tenderness. Normal range of motion.     Cervical back: Neck supple.  Lymphadenopathy:    She has no cervical adenopathy.  Neurological: She is alert and oriented to person, place, and time. She exhibits normal muscle tone. Coordination normal.  Skin: Skin is warm and dry.  dorsum of Rt foot over MTP joints pt has hyperpigmented, rough patches of skin  Psychiatric: She has a normal  mood and affect. Her behavior is normal.     A/P:  1. Annual physical exam - PAP today - declines flu and Tdap vaccines - dental and vision UTD - discussed importance of regular CV exercise, healthy diet, adequate sleep - see #3 below - Basic metabolic panel - CBC - AST - ALT - Lipid panel - TSH - VITAMIN D 25 Hydroxy (Vit-D Deficiency, Fractures) - HIV Antibody (routine testing w rflx) - next CPE in 1 year  2. Essential hypertension - not at goal based on home readings/averages - Basic metabolic panel Increase: - hydrochlorothiazide (HYDRODIURIL) 25 MG tablet; Take 1 tablet (25 mg total) by mouth daily.  Dispense: 90 tablet; Refill: 3 - increased from 12.5mg  to 25mg  daily - continue to check BP at home and keep log - f/u via MyChart in 2 wks  3. Body mass index (BMI) 34.0-34.9, adult - pt has lost 25+lbs with dietary changes and recently incorporated exercise - congratulated pt on this and encouraged her to continue! - Lipid panel - Hemoglobin A1c  4. Influenza vaccination declined by patient  5. Screening for cervical cancer - Cytology - PAP( [place])  6. Tetanus, diphtheria, and acellular pertussis (Tdap) vaccination declined  7. Dermatitis - Rt foot, dorsal aspect Rx: - triamcinolone ointment (KENALOG) 0.5 %; Apply 1 application topically 2 (two)  times daily.  Dispense: 30 g; Refill: 0  8. Leg edema, right Rx: - furosemide (LASIX) 20 MG tablet; Take 1 tablet (20 mg total) by mouth daily as needed.  Dispense: 30 tablet; Refill: 1 - pt to take 1 tab daily x 3-4 days then stop   This visit occurred during the SARS-CoV-2 public health emergency.  Safety protocols were in place, including screening questions prior to the visit, additional usage of staff PPE, and extensive cleaning of exam room while observing appropriate contact time as indicated for disinfecting solutions.

## 2019-03-05 ENCOUNTER — Encounter: Payer: Self-pay | Admitting: Family Medicine

## 2019-03-05 ENCOUNTER — Other Ambulatory Visit: Payer: Self-pay | Admitting: Family Medicine

## 2019-03-05 DIAGNOSIS — E559 Vitamin D deficiency, unspecified: Secondary | ICD-10-CM

## 2019-03-05 LAB — HIV ANTIBODY (ROUTINE TESTING W REFLEX): HIV 1&2 Ab, 4th Generation: NONREACTIVE

## 2019-03-05 MED ORDER — VITAMIN D (ERGOCALCIFEROL) 1.25 MG (50000 UNIT) PO CAPS: 50000.0000 [IU] | ORAL_CAPSULE | ORAL | 3 refills | Status: DC

## 2019-03-06 ENCOUNTER — Other Ambulatory Visit: Payer: Self-pay | Admitting: Family Medicine

## 2019-03-06 DIAGNOSIS — R87619 Unspecified abnormal cytological findings in specimens from cervix uteri: Secondary | ICD-10-CM

## 2019-03-06 DIAGNOSIS — R87810 Cervical high risk human papillomavirus (HPV) DNA test positive: Secondary | ICD-10-CM

## 2019-03-06 LAB — CYTOLOGY - PAP
Comment: NEGATIVE
Diagnosis: HIGH — AB
High risk HPV: POSITIVE — AB

## 2019-09-28 ENCOUNTER — Emergency Department (HOSPITAL_BASED_OUTPATIENT_CLINIC_OR_DEPARTMENT_OTHER)
Admission: EM | Admit: 2019-09-28 | Discharge: 2019-09-28 | Disposition: A | Discharge status: Home / Self Care | Payer: Managed Care, Other (non HMO) | Attending: Emergency Medicine | Admitting: Emergency Medicine

## 2019-09-28 ENCOUNTER — Telehealth: Payer: Managed Care, Other (non HMO) | Admitting: Nurse Practitioner

## 2019-09-28 ENCOUNTER — Encounter (HOSPITAL_BASED_OUTPATIENT_CLINIC_OR_DEPARTMENT_OTHER): Payer: Self-pay | Admitting: Emergency Medicine

## 2019-09-28 ENCOUNTER — Other Ambulatory Visit: Payer: Self-pay

## 2019-09-28 DIAGNOSIS — Z5321 Procedure and treatment not carried out due to patient leaving prior to being seen by health care provider: Secondary | ICD-10-CM | POA: Insufficient documentation

## 2019-09-28 DIAGNOSIS — I10 Essential (primary) hypertension: Secondary | ICD-10-CM | POA: Diagnosis not present

## 2019-09-28 DIAGNOSIS — R519 Headache, unspecified: Secondary | ICD-10-CM | POA: Diagnosis present

## 2019-09-28 HISTORY — DX: Essential (primary) hypertension: I10

## 2019-09-28 NOTE — ED Triage Notes (Signed)
Pt reports headache x 1 week, Hx HTN, no vision changes , no weakness. No neuro deficit, lethargy.

## 2020-01-31 ENCOUNTER — Other Ambulatory Visit: Payer: Self-pay

## 2020-01-31 ENCOUNTER — Encounter (HOSPITAL_BASED_OUTPATIENT_CLINIC_OR_DEPARTMENT_OTHER): Payer: Self-pay | Admitting: Emergency Medicine

## 2020-01-31 ENCOUNTER — Emergency Department (HOSPITAL_BASED_OUTPATIENT_CLINIC_OR_DEPARTMENT_OTHER)
Admission: EM | Admit: 2020-01-31 | Discharge: 2020-01-31 | Disposition: A | Discharge status: Home / Self Care | Payer: Managed Care, Other (non HMO) | Attending: Emergency Medicine | Admitting: Emergency Medicine

## 2020-01-31 DIAGNOSIS — Z20822 Contact with and (suspected) exposure to covid-19: Secondary | ICD-10-CM

## 2020-01-31 DIAGNOSIS — U071 COVID-19: Secondary | ICD-10-CM | POA: Insufficient documentation

## 2020-01-31 DIAGNOSIS — I10 Essential (primary) hypertension: Secondary | ICD-10-CM | POA: Diagnosis not present

## 2020-01-31 DIAGNOSIS — R509 Fever, unspecified: Secondary | ICD-10-CM | POA: Diagnosis present

## 2020-01-31 LAB — GROUP A STREP BY PCR: Group A Strep by PCR: NOT DETECTED

## 2020-01-31 MED ORDER — IBUPROFEN 800 MG PO TABS
800.0000 mg | ORAL_TABLET | Freq: Once | ORAL | Status: AC
  Administered 2020-01-31: 800 mg via ORAL
  Filled 2020-01-31: qty 1

## 2020-01-31 MED ORDER — ONDANSETRON 4 MG PO TBDP
8.0000 mg | ORAL_TABLET | Freq: Once | ORAL | Status: AC
  Administered 2020-01-31: 8 mg via ORAL
  Filled 2020-01-31: qty 2

## 2020-01-31 NOTE — ED Triage Notes (Signed)
Pt reports daughter tested positive for COVID and strep; pt is now reporting sore throat, headache, fever, and chills; pt reports getting first of the COVID shots but didn't go back for the other

## 2020-01-31 NOTE — ED Provider Notes (Signed)
[facility] Provider Note: Rita Dell, MD, FACEP  CSN: [id number] MRN: [id number] ARRIVAL: 01/31/20 at 0025 ROOM: MHFT2/MHFT2   CHIEF COMPLAINT  Covid Exposure   HISTORY OF PRESENT ILLNESS  01/31/20 4:56 AM Rita Joseph is a 40 y.o. female whose daughter was recently diagnosed with Covid.  She is here with 1 day of fever, chills sinus pain, sore throat, sweating and nausea.  She had a temperature of 100.9 on arrival here and this improved with ibuprofen.  She denies cough or shortness of breath.  She denies vomiting or diarrhea.  She denies loss of taste or smell.  She received 1 Covid vaccine but not the complete series.   Past Medical History:  Diagnosis Date  . Hypertension     History reviewed. No pertinent surgical history.  No family history on file.  Social History   Tobacco Use  . Smoking status: Never Smoker  . Smokeless tobacco: Never Used  Vaping Use  . Vaping Use: Never used  Substance Use Topics  . Alcohol use: Yes  . Drug use: No    Prior to Admission medications   Medication Sig Start Date End Date Taking? Authorizing Provider  furosemide (LASIX) 20 MG tablet Take 1 tablet (20 mg total) by mouth daily as needed. 03/04/19   Cirigliano, Jearld Lesch, DO  hydrochlorothiazide (HYDRODIURIL) 25 MG tablet Take 1 tablet (25 mg total) by mouth daily. 03/04/19   Cirigliano, Jearld Lesch, DO  triamcinolone ointment (KENALOG) 0.5 % Apply 1 application topically 2 (two) times daily. 03/04/19   Cirigliano, Jearld Lesch, DO  Vitamin D, Ergocalciferol, (DRISDOL) 1.25 MG (50000 UNIT) CAPS capsule Take 1 capsule (50,000 Units total) by mouth every 7 (seven) days. 03/05/19   CiriglianoJearld Lesch, DO    Allergies Patient has no known allergies.   REVIEW OF SYSTEMS  Negative except as noted here or in the History of Present Illness.   PHYSICAL EXAMINATION  Initial Vital Signs Blood pressure (!) 151/101, pulse 100, temperature 99.1 F (37.3 C), temperature source Oral, resp.  rate 16, height 5\' 4"  (1.626 m), weight 83 kg, SpO2 100 %.  Examination General: Well-developed, well-nourished female in no acute distress; appearance consistent with age of record HENT: normocephalic; atraumatic Eyes: pupils equal, round and reactive to light; extraocular muscles intact Neck: supple Heart: regular rate and rhythm Lungs: clear to auscultation bilaterally Abdomen: soft; nondistended; nontender; bowel sounds present Extremities: No deformity; full range of motion Neurologic: Awake, alert and oriented; motor function intact in all extremities and symmetric; no facial droop Skin: Warm and dry Psychiatric: Normal mood and affect   RESULTS  Summary of this visit's results, reviewed and interpreted by myself:   EKG Interpretation  Date/Time:    Ventricular Rate:    PR Interval:    QRS Duration:   QT Interval:    QTC Calculation:   R Axis:     Text Interpretation:        Laboratory Studies: Results for orders placed or performed during the hospital encounter of 01/31/20 (from the past 24 hour(s))  Group A Strep by PCR     Status: None   Collection Time: 01/31/20 12:40 AM   Specimen: Throat; Sterile Swab  Result Value Ref Range   Group A Strep by PCR NOT DETECTED NOT DETECTED   Imaging Studies: No results found.  ED COURSE and MDM  Nursing notes, initial and subsequent vitals signs, including pulse oximetry, reviewed and interpreted by myself.  Vitals:  01/31/20 0035 01/31/20 0036 01/31/20 0438  BP: (!) 163/98  (!) 151/101  Pulse: (!) 108  100  Resp: 16  16  Temp: (!) 100.9 F (38.3 C)  99.1 F (37.3 C)  TempSrc: Oral  Oral  SpO2: 97%  100%  Weight:  83 kg   Height:  5\' 4"  (1.626 m)    Medications  ondansetron (ZOFRAN-ODT) disintegrating tablet 8 mg (has no administration in time range)  ibuprofen (ADVIL) tablet 800 mg (800 mg Oral Given 01/31/20 0044)   Patient's presentation consistent with early Covid.  We will test her and provide her with  the latest CDC guidelines for quarantining.   PROCEDURES  Procedures   ED DIAGNOSES     ICD-10-CM   1. Suspected COVID-19 virus infection  Z20.822        Molpus, 03/30/20, MD 01/31/20 [id number]

## 2020-01-31 NOTE — ED Notes (Signed)
Discharge instructions discussed. Departs ED at this time in stable condition.

## 2020-01-31 NOTE — ED Triage Notes (Signed)
Pt took Tylenol 1 hr PTA

## 2020-02-01 LAB — SARS CORONAVIRUS 2 (TAT 6-24 HRS): SARS Coronavirus 2: POSITIVE — AB

## 2020-03-04 ENCOUNTER — Ambulatory Visit (INDEPENDENT_AMBULATORY_CARE_PROVIDER_SITE_OTHER): Payer: Managed Care, Other (non HMO) | Admitting: Family Medicine

## 2020-03-04 DIAGNOSIS — Z5329 Procedure and treatment not carried out because of patient's decision for other reasons: Secondary | ICD-10-CM

## 2020-03-04 NOTE — Progress Notes (Signed)
No show

## 2020-03-21 ENCOUNTER — Encounter: Payer: Self-pay | Admitting: Family Medicine

## 2020-03-21 ENCOUNTER — Telehealth: Payer: Self-pay | Admitting: Family Medicine

## 2020-03-21 NOTE — Telephone Encounter (Signed)
Pt was no show for appt 03/04/2020 physical. 1st occurrence. Fee waived. Letter mailed.  PCP,  Please reply back with corresponding letter matching appropriate follow up needs.  A - No follow up necessary B - Follow up urgent - locate patient immediately to schedule appointment. C - Follow up necessary. Contact patient and schedule visit w/in 7 days. D - Follow up necessary. Contact patient and schedule visit w/in 2-4 weeks.  E - Follow up necessary. Contact patient and schedule visit w/in 3 months.

## 2020-03-28 ENCOUNTER — Other Ambulatory Visit: Payer: Self-pay | Admitting: Family Medicine

## 2020-03-28 DIAGNOSIS — I10 Essential (primary) hypertension: Secondary | ICD-10-CM

## 2020-03-28 NOTE — Telephone Encounter (Signed)
Pt no showed last 2 appointments and pharmacy has been informed f/u appointments needed.

## 2020-06-29 DIAGNOSIS — Z419 Encounter for procedure for purposes other than remedying health state, unspecified: Secondary | ICD-10-CM | POA: Diagnosis not present

## 2020-07-08 ENCOUNTER — Encounter: Payer: Self-pay | Admitting: Family Medicine

## 2020-07-08 ENCOUNTER — Other Ambulatory Visit: Payer: Self-pay

## 2020-07-08 ENCOUNTER — Ambulatory Visit (INDEPENDENT_AMBULATORY_CARE_PROVIDER_SITE_OTHER): Payer: Self-pay | Admitting: Family Medicine

## 2020-07-08 VITALS — BP 105/80 | HR 103 | Temp 98.4°F | Ht 64.0 in | Wt 182.2 lb

## 2020-07-08 DIAGNOSIS — Z3201 Encounter for pregnancy test, result positive: Secondary | ICD-10-CM

## 2020-07-08 DIAGNOSIS — I10 Essential (primary) hypertension: Secondary | ICD-10-CM

## 2020-07-08 NOTE — Patient Instructions (Signed)
[facility]  [email or web address] [address], [address] [place] [address] P: [phone] F: [phone] [email or web address] Dr. Elise Leger   [facility] [email or web address] [address], [address] [place], [place] [address] fax: [phone] [email or web address]   

## 2020-07-08 NOTE — Progress Notes (Signed)
Rita Joseph is a 40 y.o. female  Chief Complaint  Patient presents with   Establish Care   Routine Prenatal Visit    Pt c/o being a high risk for pregnancy.      HPI: Rita Joseph is a 40 y.o. female patient with a h/o HTN who had positive home pregnancy test FDLMP: 04/21/20 She has h/o high risk pregnancies in the past, including miscarriage.  Does not have OB, needs referral.   Past Medical History:  Diagnosis Date   Hypertension     History reviewed. No pertinent surgical history.  Social History   Socioeconomic History   Marital status: Married    Spouse name: Not on file   Number of children: Not on file   Years of education: Not on file   Highest education level: Not on file  Occupational History   Not on file  Tobacco Use   Smoking status: Never   Smokeless tobacco: Never  Vaping Use   Vaping Use: Never used  Substance and Sexual Activity   Alcohol use: Yes   Drug use: No   Sexual activity: Yes  Other Topics Concern   Not on file  Social History Narrative   Not on file   Social Determinants of Health   Financial Resource Strain: Not on file  Food Insecurity: Not on file  Transportation Needs: Not on file  Physical Activity: Not on file  Stress: Not on file  Social Connections: Not on file  Intimate Partner Violence: Not on file    History reviewed. No pertinent family history.    There is no immunization history on file for this patient.  Outpatient Encounter Medications as of 07/08/2020  Medication Sig   hydrochlorothiazide (HYDRODIURIL) 25 MG tablet Take 1 tablet (25 mg total) by mouth daily. (Patient not taking: Reported on 07/08/2020)   triamcinolone ointment (KENALOG) 0.5 % Apply 1 application topically 2 (two) times daily.   Vitamin D, Ergocalciferol, (DRISDOL) 1.25 MG (50000 UNIT) CAPS capsule Take 1 capsule (50,000 Units total) by mouth every 7 (seven) days.   [DISCONTINUED] furosemide (LASIX) 20 MG tablet Take 1 tablet (20 mg  total) by mouth daily as needed. (Patient not taking: Reported on 07/08/2020)   No facility-administered encounter medications on file as of 07/08/2020.     ROS: Pertinent positives and negatives noted in HPI. Remainder of ROS non-contributory   No Known Allergies  BP 105/80 (BP Location: Right Arm, Patient Position: Sitting, Cuff Size: Normal)   Pulse (!) 103   Temp 98.4 F (36.9 C) (Temporal)   Ht 5\' 4"  (1.626 m)   Wt 182 lb 3.2 oz (82.6 kg)   LMP 04/21/2020   SpO2 100%   BMI 31.27 kg/m  Wt Readings from Last 3 Encounters:  07/08/20 182 lb 3.2 oz (82.6 kg)  01/31/20 183 lb (83 kg)  09/28/19 180 lb (81.6 kg)   Temp Readings from Last 3 Encounters:  07/08/20 98.4 F (36.9 C) (Temporal)  01/31/20 99.1 F (37.3 C) (Oral)  09/28/19 98.4 F (36.9 C) (Oral)   BP Readings from Last 3 Encounters:  07/08/20 105/80  01/31/20 (!) 151/101  09/28/19 (!) 154/103   Pulse Readings from Last 3 Encounters:  07/08/20 (!) 103  01/31/20 100  09/28/19 67     Physical Exam Constitutional:      General: She is not in acute distress.    Appearance: She is not ill-appearing.  Pulmonary:     Effort: No respiratory distress.  Musculoskeletal:  Right lower leg: No edema.     Left lower leg: No edema.  Neurological:     Mental Status: She is alert and oriented to person, place, and time.  Psychiatric:        Mood and Affect: Mood normal.        Behavior: Behavior normal.     A/P:  1. Positive pregnancy test - FDLMP 04/21/20 - EDD - 01/26/21 - Ambulatory referral to Obstetrics / Gynecology   2. Essential hypertension - stop HCTZ 25mg  daily. Pt was not taking for months, restarted about 1 mo ago - BP at goal so will stop HCTZ and not start labetalol at this time - Ambulatory referral to Obstetrics / Gynecology    This visit occurred during the SARS-CoV-2 public health emergency.  Safety protocols were in place, including screening questions prior to the visit,  additional usage of staff PPE, and extensive cleaning of exam room while observing appropriate contact time as indicated for disinfecting solutions.

## 2020-08-15 ENCOUNTER — Encounter: Payer: Self-pay | Admitting: Family Medicine

## 2020-08-15 ENCOUNTER — Telehealth (INDEPENDENT_AMBULATORY_CARE_PROVIDER_SITE_OTHER): Payer: Medicaid Other | Admitting: Family Medicine

## 2020-08-15 ENCOUNTER — Telehealth: Payer: Medicaid Other | Admitting: Physician Assistant

## 2020-08-15 VITALS — Ht 64.0 in | Wt 189.0 lb

## 2020-08-15 DIAGNOSIS — Z3490 Encounter for supervision of normal pregnancy, unspecified, unspecified trimester: Secondary | ICD-10-CM

## 2020-08-15 DIAGNOSIS — Z3201 Encounter for pregnancy test, result positive: Secondary | ICD-10-CM | POA: Diagnosis not present

## 2020-08-15 DIAGNOSIS — R21 Rash and other nonspecific skin eruption: Secondary | ICD-10-CM

## 2020-08-15 DIAGNOSIS — B360 Pityriasis versicolor: Secondary | ICD-10-CM | POA: Diagnosis not present

## 2020-08-15 MED ORDER — SELENIUM SULFIDE 2.25 % EX SHAM: MEDICATED_SHAMPOO | CUTANEOUS | 0 refills | Status: DC

## 2020-08-15 NOTE — Progress Notes (Addendum)
Established Patient Office Visit  Subjective:  Patient ID: Rita Joseph, female    DOB: 1980/02/05  Age: 40 y.o. MRN: [id number]  CC:  Chief Complaint  Patient presents with   Rash    Skin rash C/O dry patchy marks little itchy x 1 month. Patient would like Rx for selenium sulfide sent in for rash states that this have helped in the past    HPI Rita Joseph presents for evaluation and treatment of a rash on her torso front and back.  It is cyclical and typically occurs in the warmer months.  It is responded to selenium sulfide shampoo in the past. She is G4P2102 at 17 weeks. Has first Ob visit on 7/25.   Past Medical History:  Diagnosis Date   Hypertension     History reviewed. No pertinent surgical history.  History reviewed. No pertinent family history.  Social History   Socioeconomic History   Marital status: Married    Spouse name: Not on file   Number of children: Not on file   Years of education: Not on file   Highest education level: Not on file  Occupational History   Not on file  Tobacco Use   Smoking status: Never   Smokeless tobacco: Never  Vaping Use   Vaping Use: Never used  Substance and Sexual Activity   Alcohol use: Yes   Drug use: No   Sexual activity: Yes  Other Topics Concern   Not on file  Social History Narrative   Not on file   Social Determinants of Health   Financial Resource Strain: Not on file  Food Insecurity: Not on file  Transportation Needs: Not on file  Physical Activity: Not on file  Stress: Not on file  Social Connections: Not on file  Intimate Partner Violence: Not on file    Outpatient Medications Prior to Visit  Medication Sig Dispense Refill   triamcinolone ointment (KENALOG) 0.5 % Apply 1 application topically 2 (two) times daily. (Patient not taking: Reported on 08/15/2020) 30 g 0   Vitamin D, Ergocalciferol, (DRISDOL) 1.25 MG (50000 UNIT) CAPS capsule Take 1 capsule (50,000 Units total) by mouth every 7  (seven) days. (Patient not taking: Reported on 08/15/2020) 5 capsule 3   No facility-administered medications prior to visit.    No Known Allergies  ROS Review of Systems  Constitutional: Negative.   Respiratory: Negative.    Cardiovascular: Negative.   Gastrointestinal: Negative.   Genitourinary:  Negative for vaginal bleeding.  Skin:  Positive for rash.     Objective:    Physical Exam Vitals and nursing note reviewed.  Constitutional:      Appearance: Normal appearance.  HENT:     Head: Normocephalic.  Eyes:     General:        Right eye: No discharge.        Left eye: No discharge.  Pulmonary:     Effort: Pulmonary effort is normal.  Skin:      Neurological:     Mental Status: She is alert and oriented to person, place, and time.  Psychiatric:        Mood and Affect: Mood normal.        Behavior: Behavior normal.    Ht 5\' 4"  (1.626 m)   Wt 189 lb (85.7 kg)   LMP 04/21/2020   BMI 32.44 kg/m  Wt Readings from Last 3 Encounters:  08/15/20 189 lb (85.7 kg)  07/08/20 182 lb 3.2 oz (82.6 kg)  01/31/20 183 lb (83 kg)     Health Maintenance Due  Topic Date Due   Hepatitis C Screening  Never done   TETANUS/TDAP  Never done    There are no preventive care reminders to display for this patient.  Lab Results  Component Value Date   TSH 1.06 03/04/2019   Lab Results  Component Value Date   WBC 6.5 03/04/2019   HGB 14.7 03/04/2019   HCT 42.8 03/04/2019   MCV 95.9 03/04/2019   PLT 243.0 03/04/2019   Lab Results  Component Value Date   NA 138 03/04/2019   K 3.7 03/04/2019   CO2 28 03/04/2019   GLUCOSE 81 03/04/2019   BUN 9 03/04/2019   CREATININE 0.83 03/04/2019   AST 19 03/04/2019   ALT 26 03/04/2019   CALCIUM 9.4 03/04/2019   ANIONGAP 9 09/23/2018   GFR 93.27 03/04/2019   Lab Results  Component Value Date   CHOL 173 03/04/2019   Lab Results  Component Value Date   HDL 50.60 03/04/2019   Lab Results  Component Value Date   LDLCALC  109 (H) 03/04/2019   Lab Results  Component Value Date   TRIG 67.0 03/04/2019   Lab Results  Component Value Date   CHOLHDL 3 03/04/2019   Lab Results  Component Value Date   HGBA1C 5.4 03/04/2019      Assessment & Plan:   Problem List Items Addressed This Visit   None Visit Diagnoses     Positive pregnancy test    -  Primary   Tinea versicolor       Relevant Medications   Selenium Sulfide 2.25 % SHAM       Meds ordered this encounter  Medications   Selenium Sulfide 2.25 % SHAM    Sig: Apply to rash at night and wash off the next morning for 7 days.    Dispense:  180 mL    Refill:  0    Follow-up: Return if symptoms worsen or fail to improve.    Mliss Sax, MD Virtual Visit via Video Note  I connected with Keiondra Brookover on 09/06/20 at  3:30 PM EDT by a video enabled telemedicine application and verified that I am speaking with the correct person using two identifiers.  Location: Patient: home alone in a room.  Provider: work   I discussed the limitations of evaluation and management by telemedicine and the availability of in person appointments. The patient expressed understanding and agreed to proceed.  History of Present Illness:    Observations/Objective:   Assessment and Plan:   Follow Up Instructions:    I discussed the assessment and treatment plan with the patient. The patient was provided an opportunity to ask questions and all were answered. The patient agreed with the plan and demonstrated an understanding of the instructions.   The patient was advised to call back or seek an in-person evaluation if the symptoms worsen or if the condition fails to improve as anticipated.  I provided 23 minutes of non-face-to-face time during this encounter.   Mliss Sax, MD

## 2020-08-15 NOTE — Progress Notes (Signed)
For the safety of you and your child, I recommend a face to face office visit with a health care provider.  Many mothers need to take medicines during their pregnancy and while nursing.  Almost all medicines pass into the breast milk in small quantities.  Most are generally considered safe for a mother to take but some medicines must be avoided.  After reviewing your E-Visit request, I recommend that you consult your OB/GYN or pediatrician for medical advice in relation to your condition and prescription medications while pregnant or breastfeeding.  NOTE:  There will be NO CHARGE for this eVisit  If you are having a true medical emergency please call 911.    For an urgent face to face visit, [facility] has six urgent care centers for your convenience:     [facility] [place] Get Driving Directions [phone] [address] [place], [place] [address]    [facility] ([place]) Get Driving Directions [phone] [address] [place], [place] [address]  [facility] ([place]) Get Driving Directions [phone] [address] [place],  [place]  [address]  [facility] Get Driving Directions [phone] [address] [place], [place] [address]   [facility] [place] Get Driving Directions  [phone] [address] [place], [place] [address]    [facility] Get Driving Directions [phone] [address] [place], [place] [address]  Your MyChart E-visit questionnaire answers were reviewed by a board certified advanced clinical practitioner to complete your personal care plan based on your specific symptoms.  Thank you for using e-Visits.   I provided 5 minutes of non face-to-face time during this encounter for chart review and documentation.   

## 2020-08-19 ENCOUNTER — Telehealth: Payer: Self-pay | Admitting: Family Medicine

## 2020-08-19 NOTE — Telephone Encounter (Signed)
Pt called and said her pharmacy told her to call us to check on Prior Auth for Selenium Sulfide 2.25 % SHAM. Pt requesting call back

## 2020-08-22 DIAGNOSIS — Z3491 Encounter for supervision of normal pregnancy, unspecified, first trimester: Secondary | ICD-10-CM | POA: Diagnosis not present

## 2020-08-22 DIAGNOSIS — O3680X1 Pregnancy with inconclusive fetal viability, fetus 1: Secondary | ICD-10-CM | POA: Diagnosis not present

## 2020-08-22 IMAGING — CT CT HEAD WITHOUT CONTRAST
3 series · 16 of 47 positions shown, 19 images · non-contrast
Comparison: None.

CLINICAL DATA: Headache for 1 month

EXAM:
CT HEAD WITHOUT CONTRAST
TECHNIQUE: Contiguous axial images were obtained from the base of the skull
through the vertex without intravenous contrast.

[Series 2: head wo · axial · 0.44mm/px · z∈[+1346,+1470]mm · 10 of 31 slices shown, 13 images]
[im 3/31  brain]
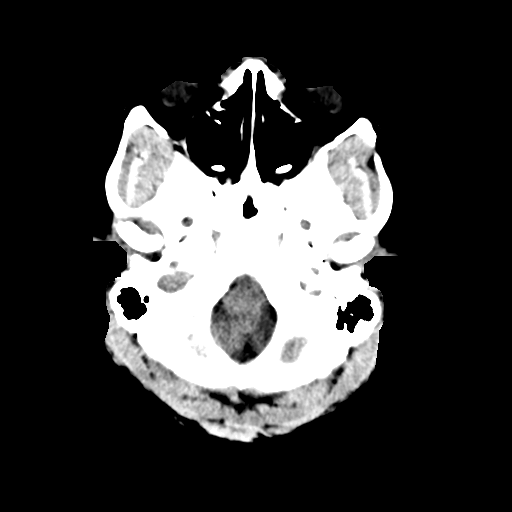
[im 3/31  bone]
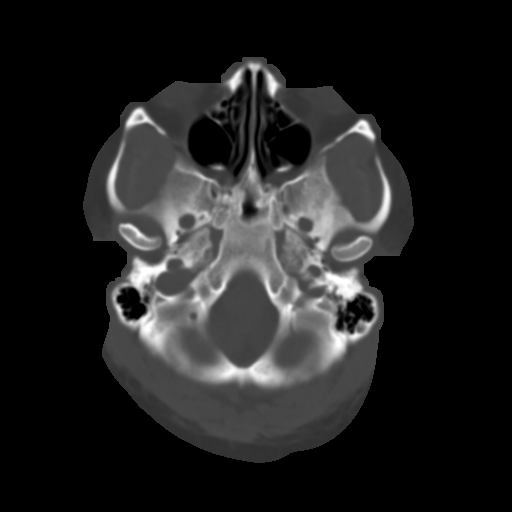
[im 6/31  brain]
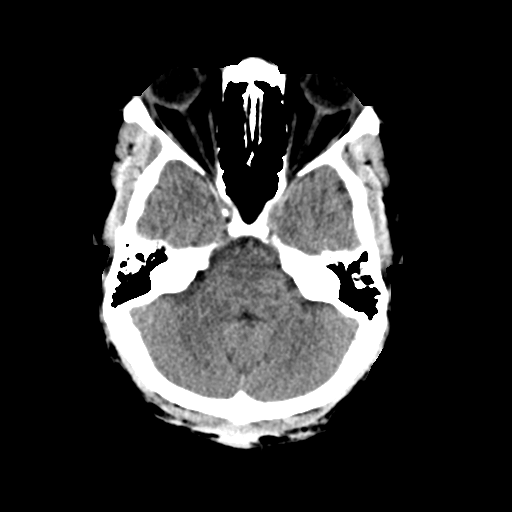
[im 9/31  brain]
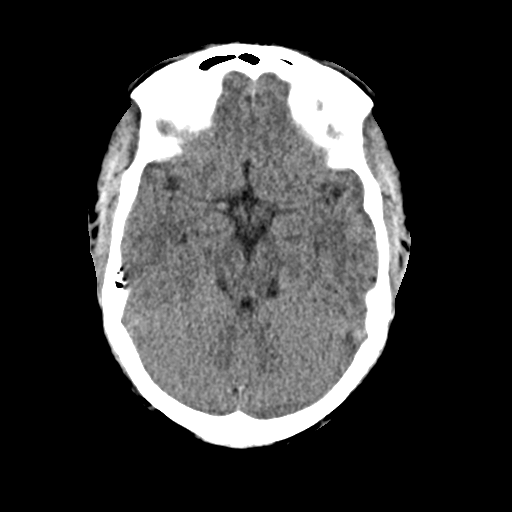
[im 11/31  brain]
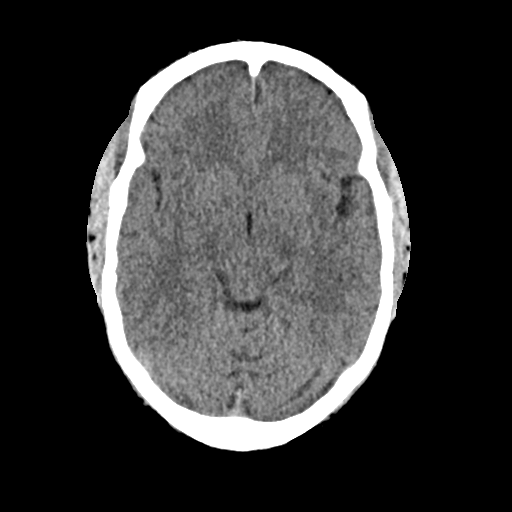
[im 14/31  brain]
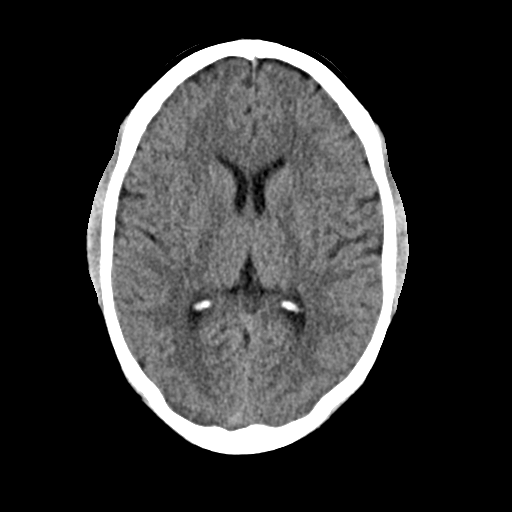
[im 14/31  bone]
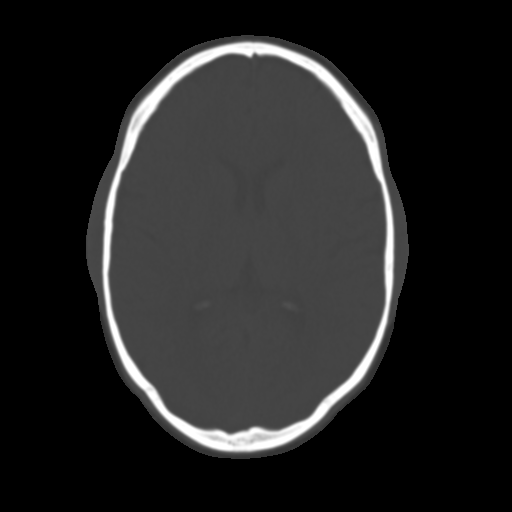
[im 17/31  brain]
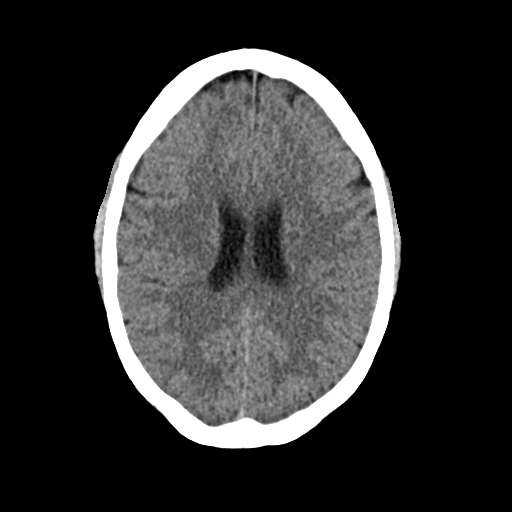
[im 20/31  brain]
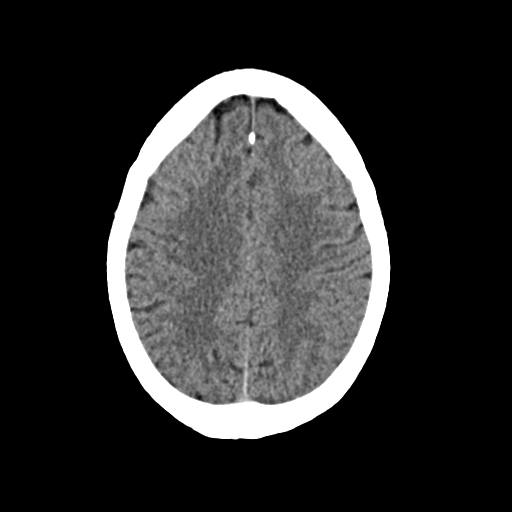
[im 23/31  brain]
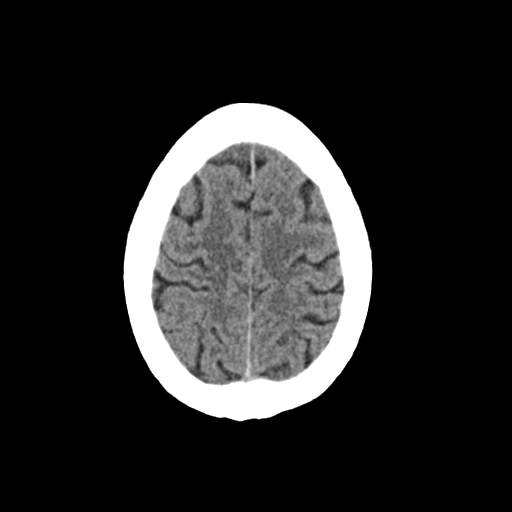
[im 25/31  brain]
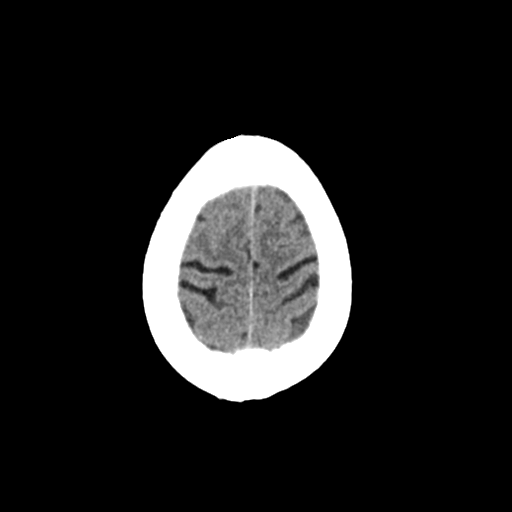
[im 25/31  bone]
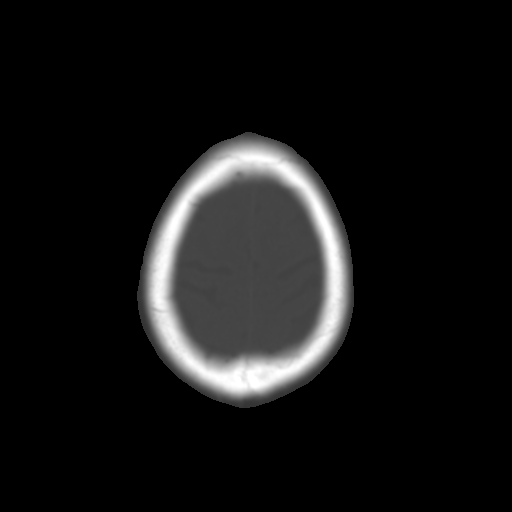
[im 28/31  brain]
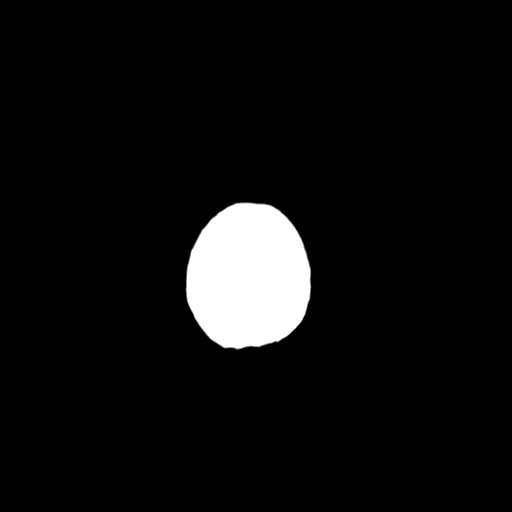

[Series 4: coronal soft · coronal · 0.31mm/px · 3 of 67 slices shown]
[im 23/67  brain]
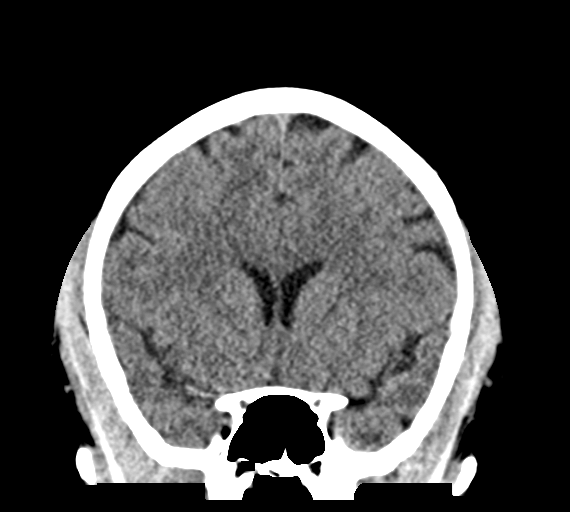
[im 30/67  brain]
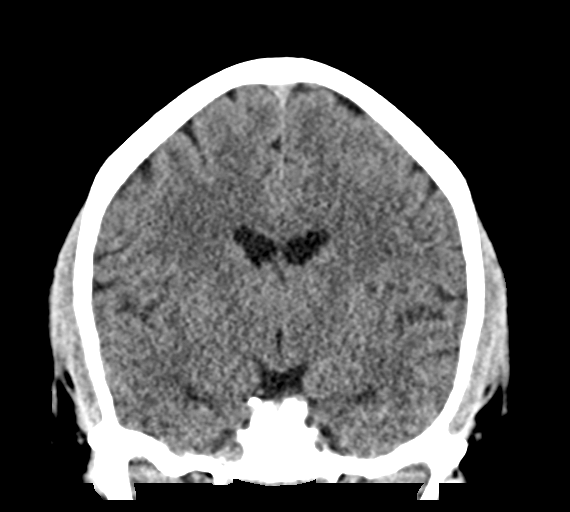
[im 37/67  brain]
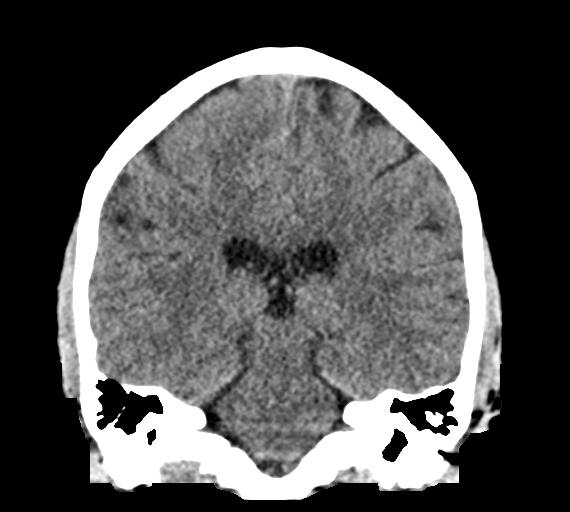

[Series 5: sag soft · sagittal · 0.31mm/px · 3 of 60 slices shown]
[im 20/60  brain]
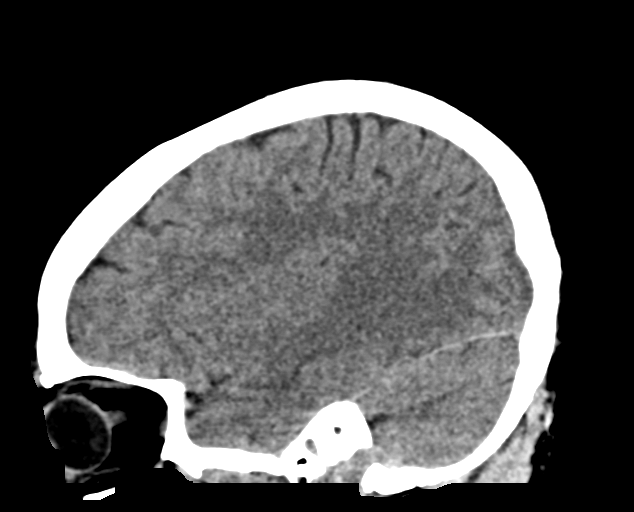
[im 30/60  brain]
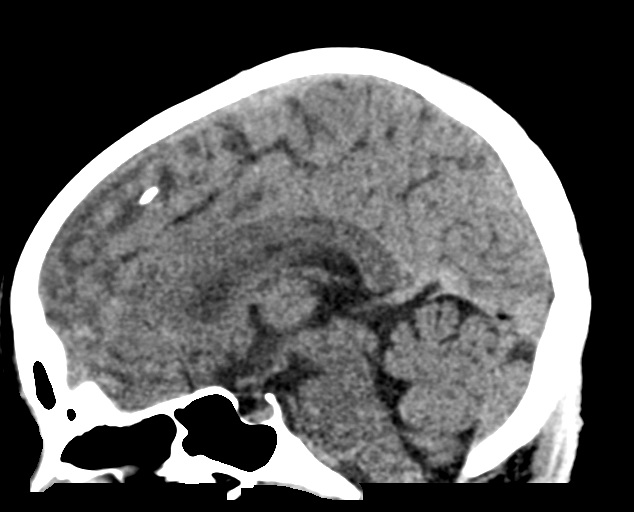
[im 40/60  brain]
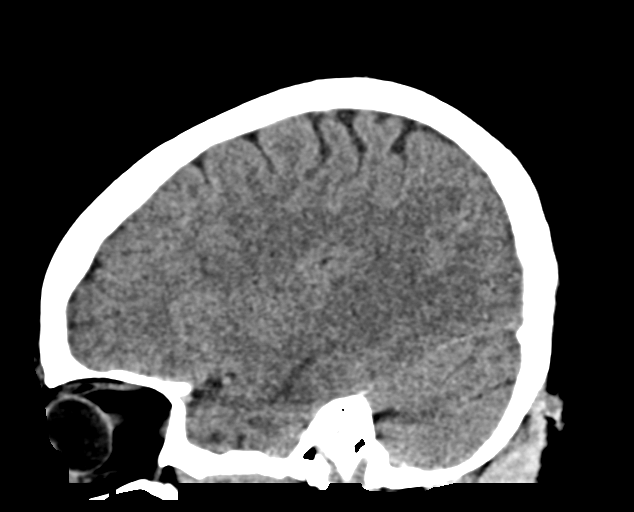

[16 of 47 positions shown; findings below may reference images not displayed]

FINDINGS: Brain: No evidence of acute infarction, hemorrhage, hydrocephalus,
extra-axial collection or mass lesion/mass effect.

Vascular: No hyperdense vessel or unexpected calcification.

Skull: Normal. Negative for fracture or focal lesion.

Sinuses/Orbits: No acute finding.

Other: None
IMPRESSION: Negative non contrasted CT appearance of the brain

## 2020-08-23 DIAGNOSIS — Z3491 Encounter for supervision of normal pregnancy, unspecified, first trimester: Secondary | ICD-10-CM | POA: Diagnosis not present

## 2020-08-23 NOTE — Telephone Encounter (Signed)
Pt checking on status of this message. Please advise 

## 2020-08-26 NOTE — Telephone Encounter (Signed)
Pt is checking on status of this message. She would like [place] to use Pharmacy  [id number] - [place],  - [address] AT [facility] [address]  [address] [place] [address]  Phone:  [phone]  Fax:  [phone]  DEA #:  [id number]   Please advise pt

## 2020-08-29 DIAGNOSIS — Z419 Encounter for procedure for purposes other than remedying health state, unspecified: Secondary | ICD-10-CM | POA: Diagnosis not present

## 2020-08-29 NOTE — Telephone Encounter (Signed)
Pt checking on status of this message, it is a PA.

## 2020-09-05 DIAGNOSIS — O09899 Supervision of other high risk pregnancies, unspecified trimester: Secondary | ICD-10-CM | POA: Diagnosis not present

## 2020-09-05 DIAGNOSIS — E669 Obesity, unspecified: Secondary | ICD-10-CM | POA: Insufficient documentation

## 2020-09-05 DIAGNOSIS — Z3689 Encounter for other specified antenatal screening: Secondary | ICD-10-CM | POA: Diagnosis not present

## 2020-09-05 DIAGNOSIS — N883 Incompetence of cervix uteri: Secondary | ICD-10-CM | POA: Insufficient documentation

## 2020-09-05 DIAGNOSIS — O3432 Maternal care for cervical incompetence, second trimester: Secondary | ICD-10-CM | POA: Diagnosis not present

## 2020-09-05 DIAGNOSIS — Z8679 Personal history of other diseases of the circulatory system: Secondary | ICD-10-CM | POA: Diagnosis not present

## 2020-09-05 DIAGNOSIS — O09522 Supervision of elderly multigravida, second trimester: Secondary | ICD-10-CM | POA: Diagnosis not present

## 2020-09-06 DIAGNOSIS — Z36 Encounter for antenatal screening for chromosomal anomalies: Secondary | ICD-10-CM | POA: Diagnosis not present

## 2020-09-06 DIAGNOSIS — O09529 Supervision of elderly multigravida, unspecified trimester: Secondary | ICD-10-CM | POA: Diagnosis not present

## 2020-09-06 DIAGNOSIS — O09219 Supervision of pregnancy with history of pre-term labor, unspecified trimester: Secondary | ICD-10-CM | POA: Diagnosis not present

## 2020-09-06 DIAGNOSIS — O09212 Supervision of pregnancy with history of pre-term labor, second trimester: Secondary | ICD-10-CM | POA: Diagnosis not present

## 2020-09-06 DIAGNOSIS — Z8632 Personal history of gestational diabetes: Secondary | ICD-10-CM | POA: Diagnosis not present

## 2020-09-06 DIAGNOSIS — Z1379 Encounter for other screening for genetic and chromosomal anomalies: Secondary | ICD-10-CM | POA: Diagnosis not present

## 2020-09-07 DIAGNOSIS — O10913 Unspecified pre-existing hypertension complicating pregnancy, third trimester: Secondary | ICD-10-CM | POA: Diagnosis not present

## 2020-09-07 DIAGNOSIS — O09522 Supervision of elderly multigravida, second trimester: Secondary | ICD-10-CM | POA: Diagnosis not present

## 2020-09-07 DIAGNOSIS — O3432 Maternal care for cervical incompetence, second trimester: Secondary | ICD-10-CM | POA: Diagnosis not present

## 2020-09-07 DIAGNOSIS — E669 Obesity, unspecified: Secondary | ICD-10-CM | POA: Diagnosis not present

## 2020-09-07 DIAGNOSIS — O99212 Obesity complicating pregnancy, second trimester: Secondary | ICD-10-CM | POA: Diagnosis not present

## 2020-09-07 DIAGNOSIS — O162 Unspecified maternal hypertension, second trimester: Secondary | ICD-10-CM | POA: Diagnosis not present

## 2020-09-07 DIAGNOSIS — O09892 Supervision of other high risk pregnancies, second trimester: Secondary | ICD-10-CM | POA: Diagnosis not present

## 2020-09-07 DIAGNOSIS — Z3A19 19 weeks gestation of pregnancy: Secondary | ICD-10-CM | POA: Diagnosis not present

## 2020-09-13 DIAGNOSIS — O99810 Abnormal glucose complicating pregnancy: Secondary | ICD-10-CM | POA: Diagnosis not present

## 2020-09-29 DIAGNOSIS — I10 Essential (primary) hypertension: Secondary | ICD-10-CM | POA: Diagnosis not present

## 2020-09-29 DIAGNOSIS — K0889 Other specified disorders of teeth and supporting structures: Secondary | ICD-10-CM | POA: Diagnosis not present

## 2020-09-29 DIAGNOSIS — Z419 Encounter for procedure for purposes other than remedying health state, unspecified: Secondary | ICD-10-CM | POA: Diagnosis not present

## 2020-10-28 DIAGNOSIS — Z8679 Personal history of other diseases of the circulatory system: Secondary | ICD-10-CM | POA: Diagnosis not present

## 2020-10-28 DIAGNOSIS — O09522 Supervision of elderly multigravida, second trimester: Secondary | ICD-10-CM | POA: Diagnosis not present

## 2020-10-28 DIAGNOSIS — O09212 Supervision of pregnancy with history of pre-term labor, second trimester: Secondary | ICD-10-CM | POA: Diagnosis not present

## 2020-10-28 DIAGNOSIS — N883 Incompetence of cervix uteri: Secondary | ICD-10-CM | POA: Diagnosis not present

## 2020-10-29 DIAGNOSIS — Z419 Encounter for procedure for purposes other than remedying health state, unspecified: Secondary | ICD-10-CM | POA: Diagnosis not present

## 2020-11-10 DIAGNOSIS — Z3A29 29 weeks gestation of pregnancy: Secondary | ICD-10-CM | POA: Diagnosis not present

## 2020-11-10 DIAGNOSIS — O24410 Gestational diabetes mellitus in pregnancy, diet controlled: Secondary | ICD-10-CM | POA: Diagnosis not present

## 2020-11-10 DIAGNOSIS — O0993 Supervision of high risk pregnancy, unspecified, third trimester: Secondary | ICD-10-CM | POA: Diagnosis not present

## 2020-11-29 DIAGNOSIS — Z419 Encounter for procedure for purposes other than remedying health state, unspecified: Secondary | ICD-10-CM | POA: Diagnosis not present

## 2020-11-30 DIAGNOSIS — Z3491 Encounter for supervision of normal pregnancy, unspecified, first trimester: Secondary | ICD-10-CM | POA: Diagnosis not present

## 2020-12-05 DIAGNOSIS — Z3493 Encounter for supervision of normal pregnancy, unspecified, third trimester: Secondary | ICD-10-CM | POA: Diagnosis not present

## 2020-12-05 DIAGNOSIS — O365131 Maternal care for known or suspected placental insufficiency, third trimester, fetus 1: Secondary | ICD-10-CM | POA: Diagnosis not present

## 2020-12-13 DIAGNOSIS — O0993 Supervision of high risk pregnancy, unspecified, third trimester: Secondary | ICD-10-CM | POA: Diagnosis not present

## 2020-12-13 DIAGNOSIS — O24414 Gestational diabetes mellitus in pregnancy, insulin controlled: Secondary | ICD-10-CM | POA: Diagnosis not present

## 2020-12-13 DIAGNOSIS — Z3A33 33 weeks gestation of pregnancy: Secondary | ICD-10-CM | POA: Diagnosis not present

## 2020-12-24 DIAGNOSIS — H5213 Myopia, bilateral: Secondary | ICD-10-CM | POA: Diagnosis not present

## 2020-12-28 DIAGNOSIS — O3433 Maternal care for cervical incompetence, third trimester: Secondary | ICD-10-CM | POA: Diagnosis not present

## 2020-12-28 DIAGNOSIS — O09293 Supervision of pregnancy with other poor reproductive or obstetric history, third trimester: Secondary | ICD-10-CM | POA: Diagnosis not present

## 2020-12-29 DIAGNOSIS — Z419 Encounter for procedure for purposes other than remedying health state, unspecified: Secondary | ICD-10-CM | POA: Diagnosis not present

## 2021-01-02 ENCOUNTER — Emergency Department (HOSPITAL_BASED_OUTPATIENT_CLINIC_OR_DEPARTMENT_OTHER)
Admission: EM | Admit: 2021-01-02 | Discharge: 2021-01-02 | Disposition: A | Discharge status: Home / Self Care | Payer: Medicaid Other | Attending: Emergency Medicine | Admitting: Emergency Medicine

## 2021-01-02 ENCOUNTER — Other Ambulatory Visit: Payer: Self-pay

## 2021-01-02 ENCOUNTER — Encounter (HOSPITAL_BASED_OUTPATIENT_CLINIC_OR_DEPARTMENT_OTHER): Payer: Self-pay

## 2021-01-02 DIAGNOSIS — Z3493 Encounter for supervision of normal pregnancy, unspecified, third trimester: Secondary | ICD-10-CM

## 2021-01-02 DIAGNOSIS — O10013 Pre-existing essential hypertension complicating pregnancy, third trimester: Secondary | ICD-10-CM | POA: Insufficient documentation

## 2021-01-02 DIAGNOSIS — Z20822 Contact with and (suspected) exposure to covid-19: Secondary | ICD-10-CM | POA: Diagnosis not present

## 2021-01-02 DIAGNOSIS — R7309 Other abnormal glucose: Secondary | ICD-10-CM | POA: Diagnosis not present

## 2021-01-02 DIAGNOSIS — Z3A37 37 weeks gestation of pregnancy: Secondary | ICD-10-CM

## 2021-01-02 DIAGNOSIS — O4703 False labor before 37 completed weeks of gestation, third trimester: Secondary | ICD-10-CM | POA: Diagnosis not present

## 2021-01-02 DIAGNOSIS — Z3A36 36 weeks gestation of pregnancy: Secondary | ICD-10-CM | POA: Insufficient documentation

## 2021-01-02 DIAGNOSIS — R000 Tachycardia, unspecified: Secondary | ICD-10-CM | POA: Diagnosis not present

## 2021-01-02 LAB — CBG MONITORING, ED: Glucose-Capillary: 195 mg/dL — ABNORMAL HIGH (ref 70–99)

## 2021-01-02 LAB — URINALYSIS, ROUTINE W REFLEX MICROSCOPIC
Bilirubin Urine: NEGATIVE
Glucose, UA: NEGATIVE mg/dL
Hgb urine dipstick: NEGATIVE
Ketones, ur: 15 mg/dL — AB
Leukocytes,Ua: NEGATIVE
Nitrite: NEGATIVE
Protein, ur: NEGATIVE mg/dL
Specific Gravity, Urine: 1.025 (ref 1.005–1.030)
pH: 6.5 (ref 5.0–8.0)

## 2021-01-02 LAB — CBC
HCT: 36.9 % (ref 36.0–46.0)
Hemoglobin: 13.3 g/dL (ref 12.0–15.0)
MCH: 34 pg (ref 26.0–34.0)
MCHC: 36 g/dL (ref 30.0–36.0)
MCV: 94.4 fL (ref 80.0–100.0)
Platelets: 182 10*3/uL (ref 150–400)
RBC: 3.91 MIL/uL (ref 3.87–5.11)
RDW: 13.4 % (ref 11.5–15.5)
WBC: 9.2 10*3/uL (ref 4.0–10.5)
nRBC: 0 % (ref 0.0–0.2)

## 2021-01-02 LAB — RESP PANEL BY RT-PCR (FLU A&B, COVID) ARPGX2
Influenza A by PCR: NEGATIVE
Influenza B by PCR: NEGATIVE
SARS Coronavirus 2 by RT PCR: NEGATIVE

## 2021-01-02 LAB — BASIC METABOLIC PANEL
Anion gap: 9 (ref 5–15)
BUN: 9 mg/dL (ref 6–20)
CO2: 20 mmol/L — ABNORMAL LOW (ref 22–32)
Calcium: 8.8 mg/dL — ABNORMAL LOW (ref 8.9–10.3)
Chloride: 103 mmol/L (ref 98–111)
Creatinine, Ser: 0.6 mg/dL (ref 0.44–1.00)
GFR, Estimated: >60 mL/min (ref >60)
Glucose, Bld: 186 mg/dL — ABNORMAL HIGH (ref 70–99)
Potassium: 3.9 mmol/L (ref 3.5–5.1)
Sodium: 132 mmol/L — ABNORMAL LOW (ref 135–145)

## 2021-01-02 LAB — ABO/RH: ABO/RH(D): AB POS

## 2021-01-02 MED ORDER — LACTATED RINGERS IV BOLUS
1000.0000 mL | Freq: Once | INTRAVENOUS | Status: AC
  Administered 2021-01-02: 1000 mL via INTRAVENOUS

## 2021-01-02 MED ORDER — ONDANSETRON HCL 4 MG/2ML IJ SOLN
4.0000 mg | Freq: Once | INTRAMUSCULAR | Status: DC
  Filled 2021-01-02: qty 2

## 2021-01-02 NOTE — Progress Notes (Signed)
Pt has an EDC of 12/29 which puts her at 36 4/[redacted] weeks gestation. Pt is also insulin dependent gestational diabetic . She takes Levemir 42 units at bedtime and Novolog 3 units before breakfast, lunch, and dinner.

## 2021-01-02 NOTE — Progress Notes (Signed)
Received  a call from [facility]. Says ED MD has checked the pt's cervix and she is 1 cm, presenting part high. Asher Muir says there is no bloody show. Pt has been on the monitor for about nine minutes. I told Asher Muir that we should watch the tracing longer so we can see how frequently she is contracting and that the FHR is reactive. I also gave her Dr. Audie Clear number- [phone] if they have any concerns.

## 2021-01-02 NOTE — ED Notes (Signed)
Pt reports "I dont feel well". EDP Steinl called to bedside. Pt vomited after provider came into room. Pt endorses feeling better at this time. Pt possible vagal r/t pain at [facility] iv site.

## 2021-01-02 NOTE — Progress Notes (Signed)
Received call from [facility]. Pt is complaining of nausea, has vomited and is feeling weak. I told Rita Joseph that the pt is insulin gestational diabetic and to check her blood sugar. Dr. Shawnie Pons here on unit. Says she has spoken to Dr. Beverlee Nims. She has reviewed  the FHR tracing.

## 2021-01-02 NOTE — ED Notes (Signed)
Pt had episode of not feeling well during IV change, became diaphoretic, vomited, and pt HR 60's. ED MD at bedside. Rapid [facility] made aware. Pt CBG 195.

## 2021-01-02 NOTE — ED Notes (Signed)
Per EDP order, pt given fluids and/or food for PO challenge. Pt verbalized understanding to utilize call bell if nausea or emesis occur. 

## 2021-01-02 NOTE — Progress Notes (Signed)
Spoke with Lestine Mount. FHR tracing is intermittent, the monitor needs to be adjusted.

## 2021-01-02 NOTE — Progress Notes (Signed)
Spoke with Lestine Mount. FHR tracing is intermittent, the monitor needs to be adjusted.If the ED MD needs to speak with Dr. Shawnie Pons he can call [phone]. She is the attending on call now.

## 2021-01-02 NOTE — ED Notes (Signed)
Per Asher Muir, Charge RN, Mary at [employer] is approving patient monitoring could end. EDP notified.

## 2021-01-02 NOTE — ED Notes (Signed)
FHR-180 

## 2021-01-02 NOTE — ED Provider Notes (Signed)
[facility] EMERGENCY DEPARTMENT Provider Note   CSN: [id number] Arrival date & time: 01/02/21  1617     History Chief Complaint  Patient presents with   Contractions    Asalee Barrette is a 40 y.o. female.  Patient indicates is 36+ weeks pregnant, G4P2, last pregnancy ~ 6 yrs ago, hx incompetent cervix and premature labor, presents indicating had contractions earlier today around  3 pm. States has prenatal care, Dr Shawnie Pons, and had cerclage sutures removed 5 days ago - denies problems with bleeding or contractions since. States over Thanksgiving had 'Braxon-Hicks' contractions, but no regular or strong contractions. States today had done her work, lifting/delivering, and felt onset lower abd/pelvic area contractions that felt like normal contractions, states had ~ 4 episodes every 6 minutes, and so came in to ED. Currently denies having pani or contractions. No vaginal fluid leak or bleeding, or abn discharge. Unsure of blood type. States plans to have baby at [facility] and [place].  EDD 01/26/21. Has felt normal fetal movements.   The history is provided by the patient and medical records.      Past Medical History:  Diagnosis Date   Hypertension     Patient Active Problem List   Diagnosis Date Noted   Vitamin D deficiency 03/05/2019   Hypertension 02/06/2019    History reviewed. No pertinent surgical history.   OB History     Gravida  4   Para      Term      Preterm      AB      Living  2      SAB      IAB      Ectopic      Multiple      Live Births  3           No family history on file.  Social History   Tobacco Use   Smoking status: Never   Smokeless tobacco: Never  Vaping Use   Vaping Use: Never used  Substance Use Topics   Alcohol use: Not Currently   Drug use: No    Home Medications Prior to Admission medications   Medication Sig Start Date End Date Taking? Authorizing Provider  Selenium Sulfide 2.25 % SHAM  Apply to rash at night and wash off the next morning for 7 days. 08/15/20   Mliss Sax, MD  triamcinolone ointment (KENALOG) 0.5 % Apply 1 application topically 2 (two) times daily. Patient not taking: Reported on 08/15/2020 03/04/19   Overton Mam, DO  Vitamin D, Ergocalciferol, (DRISDOL) 1.25 MG (50000 UNIT) CAPS capsule Take 1 capsule (50,000 Units total) by mouth every 7 (seven) days. Patient not taking: Reported on 08/15/2020 03/05/19   Overton Mam, DO    Allergies    Patient has no known allergies.  Review of Systems   Review of Systems  Constitutional:  Negative for chills and fever.  HENT:  Negative for sore throat.   Eyes:  Negative for redness.  Respiratory:  Negative for shortness of breath.   Cardiovascular:  Negative for chest pain.  Gastrointestinal:  Negative for diarrhea and vomiting.  Genitourinary:  Negative for dysuria, flank pain and vaginal bleeding.  Musculoskeletal:  Negative for back pain and neck pain.  Skin:  Negative for rash.  Neurological:  Negative for headaches.  Hematological:  Does not bruise/bleed easily.  Psychiatric/Behavioral:  Negative for confusion.    Physical Exam Updated Vital Signs BP (!) 129/93  Pulse (!) 129   Resp (!) 21   Ht 1.626 m (5\' 4" )   Wt 95.7 kg   LMP 04/21/2020   SpO2 99%   BMI 36.22 kg/m   Physical Exam Vitals and nursing note reviewed.  Constitutional:      Appearance: Normal appearance. She is well-developed.  HENT:     Head: Atraumatic.     Nose: Nose normal.     Mouth/Throat:     Mouth: Mucous membranes are moist.  Eyes:     General: No scleral icterus.    Conjunctiva/sclera: Conjunctivae normal.  Neck:     Trachea: No tracheal deviation.  Cardiovascular:     Rate and Rhythm: Regular rhythm. Tachycardia present.     Pulses: Normal pulses.     Heart sounds: Normal heart sounds. No murmur heard.   No friction rub. No gallop.  Pulmonary:     Effort: Pulmonary effort is normal. No  respiratory distress.     Breath sounds: Normal breath sounds.  Abdominal:     General: Bowel sounds are normal. There is no distension.     Palpations: Abdomen is soft.     Tenderness: There is no abdominal tenderness. There is no guarding.     Comments: Fundal height c/w dates. No abd tenderness.   Genitourinary:    Comments: No cva tenderness. Cervix high up, ~ 1 cm.  Musculoskeletal:        General: No swelling.     Cervical back: Normal range of motion and neck supple. No rigidity. No muscular tenderness.     Right lower leg: No edema.     Left lower leg: No edema.  Skin:    General: Skin is warm and dry.     Findings: No rash.  Neurological:     Mental Status: She is alert.     Comments: Alert, speech normal. Motor/sens grossly intact bil.   Psychiatric:        Mood and Affect: Mood normal.    ED Results / Procedures / Treatments   Labs (all labs ordered are listed, but only abnormal results are displayed) Results for orders placed or performed during the hospital encounter of 01/02/21  Resp Panel by RT-PCR (Flu A&B, Covid) Nasopharyngeal Swab   Specimen: Nasopharyngeal Swab; Nasopharyngeal(NP) swabs in vial transport medium  Result Value Ref Range   SARS Coronavirus 2 by RT PCR NEGATIVE NEGATIVE   Influenza A by PCR NEGATIVE NEGATIVE   Influenza B by PCR NEGATIVE NEGATIVE  CBC  Result Value Ref Range   WBC 9.2 4.0 - 10.5 K/uL   RBC 3.91 3.87 - 5.11 MIL/uL   Hemoglobin 13.3 12.0 - 15.0 g/dL   HCT 14/05/22 01.0 - 27.2 %   MCV 94.4 80.0 - 100.0 fL   MCH 34.0 26.0 - 34.0 pg   MCHC 36.0 30.0 - 36.0 g/dL   RDW 53.6 64.4 - 03.4 %   Platelets 182 150 - 400 K/uL   nRBC 0.0 0.0 - 0.2 %  Basic metabolic panel  Result Value Ref Range   Sodium 132 (L) 135 - 145 mmol/L   Potassium 3.9 3.5 - 5.1 mmol/L   Chloride 103 98 - 111 mmol/L   CO2 20 (L) 22 - 32 mmol/L   Glucose, Bld 186 (H) 70 - 99 mg/dL   BUN 9 6 - 20 mg/dL   Creatinine, Ser 74.2 0.44 - 1.00 mg/dL   Calcium 8.8  (L) 8.9 - 10.3 mg/dL   GFR, Estimated >  60 >60 mL/min   Anion gap 9 5 - 15  Urinalysis, Routine w reflex microscopic Nasopharyngeal Swab  Result Value Ref Range   Color, Urine YELLOW YELLOW   APPearance CLEAR CLEAR   Specific Gravity, Urine 1.025 1.005 - 1.030   pH 6.5 5.0 - 8.0   Glucose, UA NEGATIVE NEGATIVE mg/dL   Hgb urine dipstick NEGATIVE NEGATIVE   Bilirubin Urine NEGATIVE NEGATIVE   Ketones, ur 15 (A) NEGATIVE mg/dL   Protein, ur NEGATIVE NEGATIVE mg/dL   Nitrite NEGATIVE NEGATIVE   Leukocytes,Ua NEGATIVE NEGATIVE  CBG monitoring, ED  Result Value Ref Range   Glucose-Capillary 195 (H) 70 - 99 mg/dL    EKG None  Radiology No results found.  Procedures Procedures   Medications Ordered in ED Medications - No data to display  ED Course  I have reviewed the triage vital signs and the nursing notes.  Pertinent labs & imaging results that were available during my care of the patient were reviewed by me and considered in my medical decision making (see chart for details).    MDM Rules/Calculators/A&P                           Patient placed on fetal monitor. OB/gyn consulted.   Reviewed nursing notes and prior charts for additional history.   Patient indicates sees Dr Shawnie Pons, but also indicates is wanting to have her baby at [facility] and [place].   Rapid response ob nurse notified.   LR bolus.   Labs reviewed/interpreted by me - wbc and hgb normal, non-fasting glucose not low   Discussed pt with Dr Shawnie Pons, as pt was indicating her site of preference for delivery was [facility] - she indicates to monitor ~ 1 hr, rapid response RN will monitor, and indicates if not in active labor, to d/c to home, with instructions to return to [place] or [facility], depending on where patient decided on delivery.  Post iv placement, pt with episode of feeling faint and nauseated - vomiting yellowish gastric contents, no bloody or bilious emesis. During episode hr slowed from baseline 106, to  66, and then quickly back to low 100s - remained sinus, without block - suspect vagal episode.   Ivf. Po fluids. No recurrent nausea or vomiting. No abd or pelvic pain. No cramping or contractions.   After an hour on monitor, RN to recontact rapid response for report.   Pt continues to feels improved.  Tolerating po. No nv. No abd or pelvic pain.   Rapid response ob rn indicates has spoken with Dr Shawnie Pons and reviewed monitor, and no contractions, baby looks good/reassuring, and ok for d/c.   Return precautions provided.        Final Clinical Impression(s) / ED Diagnoses Final diagnoses:  None    Rx / DC Orders ED Discharge Orders     None        Cathren Laine, MD 01/02/21 1839

## 2021-01-02 NOTE — Progress Notes (Signed)
Received call from [facility] RN, Asher Muir. Pt is a G4P2 at [redacted] weeks gestation presenting with c/o contractions. Pt gets her care in [facility] with Dr. Shawnie Pons. No vaginal bleeding or leaking of fluid. Pt had her cerclage removed two days ago.

## 2021-01-02 NOTE — Progress Notes (Signed)
Spoke with Rita Joseph. Pt is ready for d/c and she is OB cleared. Dr. Alysia Penna notified.

## 2021-01-02 NOTE — ED Notes (Signed)
Pt discharged to home. Discharge instructions have been discussed with patient and/or family members. Pt verbally acknowledges understanding d/c instructions, and endorses comprehension to checkout at registration before leaving.  °

## 2021-01-02 NOTE — ED Triage Notes (Signed)
Pt arrives POV to the ED with reports of contractions every 5-6 minutes, reports she had about 4-5 episodes of this prior to coming to ED. Pt reports 4 pregnancies with 2 living children. EDD 01-26-21, had stitches for cerclage removed 2 days ago. Denies any leaking of vaginal fluids, mucus, or blood. Dr. Shawnie Pons is patients OBGYN.   Rapid response OB Mary made aware, pt being placed on fetal monitor at this time.   Steinl MD at bedside speaking with patient at this time.

## 2021-01-02 NOTE — Discharge Instructions (Addendum)
It was our pleasure to provide your ER care today - we hope that you feel better.  Follow up with your ob/gyn doctor tomorrow - make sure to discuss a definite plan for which hospital you will be going to if/when you go into labor.   Go directly to [facility] or  [facility] [place] if in labor, vaginal bleeding, pelvic pain, or other concern.

## 2021-01-02 NOTE — Progress Notes (Signed)
Patient ID: [name], female   DOB: [birth year], 40 y.o.   MRN: [id number]   OB/GYN Telephone Consult   Rita Joseph is a 40 y.o. G4P2 currently at 36.5  weeks presenting to ED for contractions. Has insulin requiring diabetes. Has h/o PTB and cerclage with removal on 11/30. I was called for a consult regarding the care of this patient by [facility]                                                          .    The provider had a clinical question about preterm labor. Reports patient has stated contractions are no longer present.  The provider presented the following relevant clinical information: As noted above and on exam fetal head is out of the pelvis and is 1 cm and thick   I performed a chart review on the patient and reviewed available documentation.  BP (!) 126/96   Pulse (!) 102   Temp 99.2 F (37.3 C) (Oral)   Resp (!) 24   Ht 5\' 4"  (1.626 m)   Wt 95.7 kg   LMP 04/21/2020   SpO2 98%   BMI 36.22 kg/m   Exam- performed by consulting provider FHR is reassuring on monitoring No contractions are noted here  Recommendations:  -continue to monitor x 1 hour, then re-check patient -if no change, home with labor precautions, please go to [facility] or [facility] for labor -May want to check CBG if needed. -f/u with provider as scheduled.  Thank you for this consult and if additional recommendations are needed please call [phone] for the OB/GYN attending on service at [facility].   I spent approximately 5 minutes directly consulting with the provider and verbally discussing this case. Additionally 10 minutes minutes was spent performing chart review and documentation.    [facility], MD

## 2021-01-03 ENCOUNTER — Telehealth: Payer: Self-pay

## 2021-01-03 NOTE — Telephone Encounter (Signed)
Transition Care Management Follow-up Telephone Call Date of discharge and from where: 01/02/2021 from [facility] How have you been since you were released from the hospital? Pt stated that she is resting today.  Any questions or concerns? No  Items Reviewed: Did the pt receive and understand the discharge instructions provided? Yes  Medications obtained and verified? Yes  Other? No  Any new allergies since your discharge? No  Dietary orders reviewed? No Do you have support at home? Yes   Functional Questionnaire: (I = Independent and D = Dependent) ADLs: I  Bathing/Dressing- I  Meal Prep- I  Eating- I  Maintaining continence- I  Transferring/Ambulation- I  Managing Meds- I   Follow up appointments reviewed:  PCP Hospital f/u appt confirmed? No   Specialist Hospital f/u appt confirmed? No   Are transportation arrangements needed? No  If their condition worsens, is the pt aware to call PCP or go to the Emergency Dept.? Yes Was the patient provided with contact information for the PCP's office or ED? Yes Was to pt encouraged to call back with questions or concerns? Yes

## 2021-01-11 DIAGNOSIS — Z3483 Encounter for supervision of other normal pregnancy, third trimester: Secondary | ICD-10-CM | POA: Diagnosis not present

## 2021-01-11 DIAGNOSIS — Z113 Encounter for screening for infections with a predominantly sexual mode of transmission: Secondary | ICD-10-CM | POA: Diagnosis not present

## 2021-01-11 DIAGNOSIS — O365931 Maternal care for other known or suspected poor fetal growth, third trimester, fetus 1: Secondary | ICD-10-CM | POA: Diagnosis not present

## 2021-01-11 DIAGNOSIS — O365131 Maternal care for known or suspected placental insufficiency, third trimester, fetus 1: Secondary | ICD-10-CM | POA: Diagnosis not present

## 2021-01-11 DIAGNOSIS — Z3493 Encounter for supervision of normal pregnancy, unspecified, third trimester: Secondary | ICD-10-CM | POA: Diagnosis not present

## 2021-01-11 DIAGNOSIS — Z118 Encounter for screening for other infectious and parasitic diseases: Secondary | ICD-10-CM | POA: Diagnosis not present

## 2021-01-13 ENCOUNTER — Other Ambulatory Visit: Payer: Self-pay

## 2021-01-13 ENCOUNTER — Encounter (HOSPITAL_COMMUNITY): Payer: Self-pay | Admitting: Obstetrics & Gynecology

## 2021-01-13 ENCOUNTER — Inpatient Hospital Stay (HOSPITAL_COMMUNITY)
Admission: AD | Admit: 2021-01-13 | Discharge: 2021-01-13 | Disposition: A | Discharge status: Home / Self Care | Payer: Medicaid Other | Attending: Obstetrics & Gynecology | Admitting: Obstetrics & Gynecology

## 2021-01-13 DIAGNOSIS — Z3689 Encounter for other specified antenatal screening: Secondary | ICD-10-CM | POA: Insufficient documentation

## 2021-01-13 DIAGNOSIS — O368130 Decreased fetal movements, third trimester, not applicable or unspecified: Secondary | ICD-10-CM | POA: Insufficient documentation

## 2021-01-13 DIAGNOSIS — O09523 Supervision of elderly multigravida, third trimester: Secondary | ICD-10-CM | POA: Insufficient documentation

## 2021-01-13 DIAGNOSIS — Z3A38 38 weeks gestation of pregnancy: Secondary | ICD-10-CM | POA: Diagnosis not present

## 2021-01-13 NOTE — MAU Note (Signed)
Pt reports not feeling baby move most of the day. Denies any pain or ctx. No leaking of fluid or vag bleeding reported.  Pt has chronic HTN(no Meds). GDM on insilin. Get care in [facility] Dr. Shawnie Pons.

## 2021-01-13 NOTE — MAU Provider Note (Signed)
History     CSN: [id number]  Arrival date and time: 01/13/21 1936   Event Date/Time   First Provider Initiated Contact with Patient 01/13/21 2014      Chief Complaint  Patient presents with   Decreased Fetal Movement   HPI Rita Joseph is a 40 y.o. [id number] at [redacted]w[redacted]d who presents to MAU with chief complaint of decreased fetal movement. This is a new problem, onset today. Patient states she usually has a "wake up" active period during or immediately after breakfast. She did not have that level of movement today. Her baby is also consistently active in the late afternoon but she did not experience the typical level of movement at that time. Patient reports to CNM she thought the expectation was ten movements in one hour. She denies vaginal bleeding, leaking of fluid, decreased fetal movement, fever, falls, or recent illness.   Patient receives care with Dr. Shawnie Pons in the [facility] system.  OB History     Gravida  4   Para      Term      Preterm      AB      Living  2      SAB      IAB      Ectopic      Multiple      Live Births  3           Past Medical History:  Diagnosis Date   Hypertension     Past Surgical History:  Procedure Laterality Date   CERVICAL CERCLAGE      No family history on file.  Social History   Tobacco Use   Smoking status: Never   Smokeless tobacco: Never  Vaping Use   Vaping Use: Never used  Substance Use Topics   Alcohol use: Not Currently   Drug use: No    Allergies: No Known Allergies  Medications Prior to Admission  Medication Sig Dispense Refill Last Dose   Prenatal Vit-Fe Fumarate-FA (MULTIVITAMIN-PRENATAL) 27-0.8 MG TABS tablet Take 1 tablet by mouth daily at 12 noon.   01/13/2021   Selenium Sulfide 2.25 % SHAM Apply to rash at night and wash off the next morning for 7 days. 180 mL 0    triamcinolone ointment (KENALOG) 0.5 % Apply 1 application topically 2 (two) times daily. (Patient not taking: Reported on  08/15/2020) 30 g 0    Vitamin D, Ergocalciferol, (DRISDOL) 1.25 MG (50000 UNIT) CAPS capsule Take 1 capsule (50,000 Units total) by mouth every 7 (seven) days. (Patient not taking: Reported on 08/15/2020) 5 capsule 3     Review of Systems  Constitutional:  Negative for fever.  Gastrointestinal:  Negative for abdominal pain.  Genitourinary:  Negative for vaginal bleeding and vaginal discharge.  All other systems reviewed and are negative. Physical Exam   Blood pressure 133/82, pulse 98, temperature 98.4 F (36.9 C), resp. rate 18, height 5\' 4"  (1.626 m), weight 96.6 kg, last menstrual period 04/21/2020.  Physical Exam Vitals and nursing note reviewed. Exam conducted with a chaperone present.  Constitutional:      Appearance: Normal appearance.  Cardiovascular:     Rate and Rhythm: Normal rate.     Pulses: Normal pulses.  Pulmonary:     Effort: Pulmonary effort is normal.  Abdominal:     Comments: Gravid  Skin:    Capillary Refill: Capillary refill takes less than 2 seconds.  Neurological:     Mental Status: She is alert and oriented to  person, place, and time.  Psychiatric:        Mood and Affect: Mood normal.        Behavior: Behavior normal.        Thought Content: Thought content normal.        Judgment: Judgment normal.    MAU Course  Procedures  --Reactive tracing: baseline 155, mod var, + accels, no decels --Toco: rare contraction --Cat I throughout one hour of monitoring --Patient pushed NST button to mark fetal movement 25 times in one hour  Patient Vitals for the past 24 hrs:  BP Temp Pulse Resp Height Weight  01/13/21 1953 133/82 98.4 F (36.9 C) 98 18 5\' 4"  (1.626 m) 96.6 kg    Assessment and Plan  --40 y.o. G4P0 at [redacted]w[redacted]d  --Cat I tracing --Patient endorses "very active" fetal movement throughout MAU evaluation --Discharge home in stable condition  F/U: Patient's next prenatal appointment is Monday 01/16/2021  01/18/2021, MSA, MSN,  CNM Certified Nurse Midwife, [facility] Rita Joseph, Rita Joseph

## 2021-01-19 DIAGNOSIS — Z794 Long term (current) use of insulin: Secondary | ICD-10-CM | POA: Diagnosis not present

## 2021-01-19 DIAGNOSIS — O24424 Gestational diabetes mellitus in childbirth, insulin controlled: Secondary | ICD-10-CM | POA: Diagnosis not present

## 2021-01-19 DIAGNOSIS — Z3A39 39 weeks gestation of pregnancy: Secondary | ICD-10-CM | POA: Diagnosis not present

## 2021-01-19 DIAGNOSIS — Z91199 Patient's noncompliance with other medical treatment and regimen due to unspecified reason: Secondary | ICD-10-CM | POA: Diagnosis not present

## 2021-01-19 DIAGNOSIS — O9081 Anemia of the puerperium: Secondary | ICD-10-CM | POA: Diagnosis not present

## 2021-01-19 DIAGNOSIS — D62 Acute posthemorrhagic anemia: Secondary | ICD-10-CM | POA: Diagnosis not present

## 2021-01-19 DIAGNOSIS — O164 Unspecified maternal hypertension, complicating childbirth: Secondary | ICD-10-CM | POA: Diagnosis not present

## 2021-01-24 ENCOUNTER — Telehealth: Payer: Self-pay

## 2021-01-24 NOTE — Telephone Encounter (Signed)
Transition Care Management Follow-up Telephone Call Date of discharge and from where: 01/22/2021-[facility] How have you been since you were released from the hospital? Patient stated she is doing ok but still has some foot pain but will be scheduling her appointment today  Any questions or concerns? No  Items Reviewed: Did the pt receive and understand the discharge instructions provided? Yes  Medications obtained and verified? Yes  Other? No  Any new allergies since your discharge? No  Dietary orders reviewed? No Do you have support at home? Yes   Home Care and Equipment/Supplies: Were home health services ordered? not applicable If so, what is the name of the agency? N/A  Has the agency set up a time to come to the patient's home? not applicable Were any new equipment or medical supplies ordered?  No What is the name of the medical supply agency? N/A Were you able to get the supplies/equipment? not applicable Do you have any questions related to the use of the equipment or supplies? No  Functional Questionnaire: (I = Independent and D = Dependent) ADLs: I  Bathing/Dressing- I  Meal Prep- I  Eating- I  Maintaining continence- I  Transferring/Ambulation- I  Managing Meds- I  Follow up appointments reviewed:  PCP Hospital f/u appt confirmed? No   Specialist Hospital f/u appt confirmed? No   Are transportation arrangements needed? No  If their condition worsens, is the pt aware to call PCP or go to the Emergency Dept.? Yes Was the patient provided with contact information for the PCP's office or ED? Yes Was to pt encouraged to call back with questions or concerns? Yes

## 2021-01-29 DIAGNOSIS — Z419 Encounter for procedure for purposes other than remedying health state, unspecified: Secondary | ICD-10-CM | POA: Diagnosis not present

## 2021-02-06 ENCOUNTER — Telehealth: Payer: Self-pay | Admitting: Family Medicine

## 2021-02-06 ENCOUNTER — Telehealth: Payer: Medicaid Other | Admitting: Family Medicine

## 2021-02-06 NOTE — Telephone Encounter (Signed)
Pt no showed for a virtual visit. [name] called a few times, no answer.

## 2021-02-07 ENCOUNTER — Encounter: Payer: Self-pay | Admitting: Family Medicine

## 2021-02-07 NOTE — Telephone Encounter (Signed)
2nd no show,  Letter sent via mychart and mail for pt to call to schedule an appointment to establish care with a new provider since Dr. Luana Shu is no longer practicing at our office.   missed 2 visits with our office (03/04/2020 and 02/06/2021).   Additional late arrivals, late cancellations, or no show appointments may result in being dismissed from our office.

## 2021-02-20 DIAGNOSIS — I10 Essential (primary) hypertension: Secondary | ICD-10-CM | POA: Diagnosis not present

## 2021-03-01 DIAGNOSIS — Z419 Encounter for procedure for purposes other than remedying health state, unspecified: Secondary | ICD-10-CM | POA: Diagnosis not present

## 2021-03-09 DIAGNOSIS — Z713 Dietary counseling and surveillance: Secondary | ICD-10-CM | POA: Diagnosis not present

## 2021-03-29 DIAGNOSIS — H52223 Regular astigmatism, bilateral: Secondary | ICD-10-CM | POA: Diagnosis not present

## 2021-03-29 DIAGNOSIS — Z419 Encounter for procedure for purposes other than remedying health state, unspecified: Secondary | ICD-10-CM | POA: Diagnosis not present

## 2021-03-29 DIAGNOSIS — H5213 Myopia, bilateral: Secondary | ICD-10-CM | POA: Diagnosis not present

## 2021-04-29 DIAGNOSIS — Z419 Encounter for procedure for purposes other than remedying health state, unspecified: Secondary | ICD-10-CM | POA: Diagnosis not present

## 2021-05-29 DIAGNOSIS — Z419 Encounter for procedure for purposes other than remedying health state, unspecified: Secondary | ICD-10-CM | POA: Diagnosis not present

## 2021-06-29 DIAGNOSIS — Z419 Encounter for procedure for purposes other than remedying health state, unspecified: Secondary | ICD-10-CM | POA: Diagnosis not present

## 2021-07-29 DIAGNOSIS — Z419 Encounter for procedure for purposes other than remedying health state, unspecified: Secondary | ICD-10-CM | POA: Diagnosis not present

## 2021-08-29 DIAGNOSIS — Z419 Encounter for procedure for purposes other than remedying health state, unspecified: Secondary | ICD-10-CM | POA: Diagnosis not present

## 2021-09-06 ENCOUNTER — Ambulatory Visit (INDEPENDENT_AMBULATORY_CARE_PROVIDER_SITE_OTHER): Payer: Medicaid Other | Admitting: Family Medicine

## 2021-09-06 ENCOUNTER — Encounter: Payer: Self-pay | Admitting: Family Medicine

## 2021-09-06 VITALS — BP 120/78 | HR 97 | Temp 97.5°F | Ht 64.0 in | Wt 216.8 lb

## 2021-09-06 DIAGNOSIS — F418 Other specified anxiety disorders: Secondary | ICD-10-CM

## 2021-09-06 DIAGNOSIS — R7309 Other abnormal glucose: Secondary | ICD-10-CM

## 2021-09-06 DIAGNOSIS — R519 Headache, unspecified: Secondary | ICD-10-CM | POA: Diagnosis not present

## 2021-09-06 DIAGNOSIS — I10 Essential (primary) hypertension: Secondary | ICD-10-CM | POA: Diagnosis not present

## 2021-09-06 LAB — BASIC METABOLIC PANEL
BUN: 9 mg/dL (ref 6–23)
CO2: 27 mEq/L (ref 19–32)
Calcium: 9.4 mg/dL (ref 8.4–10.5)
Chloride: 100 mEq/L (ref 96–112)
Creatinine, Ser: 0.81 mg/dL (ref 0.40–1.20)
GFR: 90.96 mL/min (ref >60.00)
Glucose, Bld: 186 mg/dL — ABNORMAL HIGH (ref 70–99)
Potassium: 3.8 mEq/L (ref 3.5–5.1)
Sodium: 137 mEq/L (ref 135–145)

## 2021-09-06 MED ORDER — FLUOXETINE HCL 10 MG PO CAPS: ORAL_CAPSULE | ORAL | 0 refills | Status: DC

## 2021-09-06 MED ORDER — PREDNISONE 20 MG PO TABS: 20.0000 mg | ORAL_TABLET | Freq: Two times a day (BID) | ORAL | 0 refills | Status: AC

## 2021-09-06 NOTE — Progress Notes (Signed)
Established Patient Office Visit  Subjective   Patient ID: Rita Joseph, female    DOB: August 19, 1980  Age: 41 y.o. MRN: [id number]  Chief Complaint  Patient presents with   Transitions Of Care    TOC from Dr. Barron Alvine, discuss hypertension. Patient not fasting.     HPI for follow-up of hypertension treated with HCTZ.  Blood pressure has been higher at home but she has discovered that her cuff needs new batteries.  She has at adequate response in the past to the HCTZ.  She has a busy household with a 33 year old, 26-year-old and a 42-month-old.  She has been a little sad and down since parturition.  Energy levels have been down.  She is having difficulty using weight.  She also describes regular nonprogressive headaches that are preceded by brief visual changes.  Headaches can last for hours.  They have been treated adequately with Tylenol.  She denies nausea or light sensitivity.  Headaches seem to start 3 to 4 years ago.  She has not history of headaches.    Review of Systems  Constitutional:  Negative for chills, diaphoresis, malaise/fatigue and weight loss.  HENT: Negative.    Eyes: Negative.  Negative for blurred vision and double vision.  Cardiovascular:  Negative for chest pain.  Gastrointestinal:  Negative for abdominal pain, nausea and vomiting.  Genitourinary: Negative.   Musculoskeletal:  Negative for falls and myalgias.  Neurological:  Positive for headaches. Negative for dizziness, tingling, speech change, loss of consciousness and weakness.  Psychiatric/Behavioral:  Positive for depression. The patient is nervous/anxious.       09/06/2021   11:37 AM 09/06/2021   10:58 AM 08/15/2020    3:36 PM  Depression screen PHQ 2/9  Decreased Interest 1 0 0  Down, Depressed, Hopeless 1 1 0  PHQ - 2 Score 2 1 0  Altered sleeping 1    Tired, decreased energy 1    Change in appetite 1    Feeling bad or failure about yourself  0    Trouble concentrating 0    Moving slowly or  fidgety/restless 0    Suicidal thoughts 0    PHQ-9 Score 5    Difficult doing work/chores Somewhat difficult         Objective:     BP 120/78 (BP Location: Right Arm, Patient Position: Sitting, Cuff Size: Large)   Pulse 97   Temp (!) 97.5 F (36.4 C) (Temporal)   Ht 5\' 4"  (1.626 m)   Wt 216 lb 12.8 oz (98.3 kg)   SpO2 98%   BMI 37.21 kg/m    Physical Exam Constitutional:      General: She is not in acute distress.    Appearance: Normal appearance. She is not ill-appearing, toxic-appearing or diaphoretic.  HENT:     Head: Normocephalic and atraumatic.     Right Ear: External ear normal.     Left Ear: External ear normal.     Mouth/Throat:     Mouth: Mucous membranes are moist.     Pharynx: Oropharynx is clear. No oropharyngeal exudate or posterior oropharyngeal erythema.  Eyes:     General: No visual field deficit or scleral icterus.       Right eye: No discharge.        Left eye: No discharge.     Extraocular Movements: Extraocular movements intact.     Conjunctiva/sclera: Conjunctivae normal.     Pupils: Pupils are equal, round, and reactive to light.  Cardiovascular:  Rate and Rhythm: Normal rate and regular rhythm.  Pulmonary:     Effort: Pulmonary effort is normal. No respiratory distress.     Breath sounds: Normal breath sounds.  Abdominal:     Tenderness: There is no guarding.  Musculoskeletal:     Cervical back: No rigidity or tenderness.  Skin:    General: Skin is warm and dry.  Neurological:     Mental Status: She is alert and oriented to person, place, and time.     Cranial Nerves: No cranial nerve deficit, dysarthria or facial asymmetry.     Motor: Motor function is intact.  Psychiatric:        Mood and Affect: Mood normal.        Behavior: Behavior normal.      Results for orders placed or performed in visit on 09/06/21  Basic metabolic panel  Result Value Ref Range   Sodium 137 135 - 145 mEq/L   Potassium 3.8 3.5 - 5.1 mEq/L    Chloride 100 96 - 112 mEq/L   CO2 27 19 - 32 mEq/L   Glucose, Bld 186 (H) 70 - 99 mg/dL   BUN 9 6 - 23 mg/dL   Creatinine, Ser 6.96 0.40 - 1.20 mg/dL   GFR 29.52 >84.13 mL/min   Calcium 9.4 8.4 - 10.5 mg/dL      The 24-MWNU ASCVD risk score (Arnett DK, et al., 2019) is: 1.1%    Assessment & Plan:   Problem List Items Addressed This Visit   None Visit Diagnoses     Essential hypertension    -  Primary   Relevant Medications   hydrochlorothiazide (HYDRODIURIL) 25 MG tablet   Other Relevant Orders   Basic metabolic panel (Completed)   Nonintractable headache, unspecified chronicity pattern, unspecified headache type       Relevant Medications   acetaminophen (TYLENOL) 325 MG tablet   predniSONE (DELTASONE) 20 MG tablet   FLUoxetine (PROZAC) 10 MG capsule   Depression with anxiety       Relevant Medications   FLUoxetine (PROZAC) 10 MG capsule   Elevated glucose       Relevant Orders   Hemoglobin A1c       Return in about 5 weeks (around 10/11/2021).  Will bring blood pressure cuff with her next visit.  Continue HCTZ.  Will start Prozac 10 mg to 20.  Discussed reasonable expectations.  Brief course of prednisone to hopefully help with headaches and rebound phenomenon.  Hopefully he has mood elevates and depression abate headaches will resolve.  Can consider migraine medicines and preventatives as needed.  Information was given on mindfulness based stress reduction as well as managing hypertension.  Mliss Sax, MD

## 2021-09-07 NOTE — Addendum Note (Signed)
Addended by: Andrez Grime on: 09/07/2021 12:53 PM   Modules accepted: Orders

## 2021-09-15 ENCOUNTER — Other Ambulatory Visit: Payer: Medicaid Other

## 2021-09-18 ENCOUNTER — Other Ambulatory Visit (INDEPENDENT_AMBULATORY_CARE_PROVIDER_SITE_OTHER): Payer: Medicaid Other

## 2021-09-18 DIAGNOSIS — R7309 Other abnormal glucose: Secondary | ICD-10-CM

## 2021-09-18 LAB — HEMOGLOBIN A1C: Hgb A1c MFr Bld: 6.5 % (ref 4.6–6.5)

## 2021-09-27 ENCOUNTER — Other Ambulatory Visit: Payer: Self-pay | Admitting: Family Medicine

## 2021-09-27 DIAGNOSIS — F418 Other specified anxiety disorders: Secondary | ICD-10-CM

## 2021-09-29 DIAGNOSIS — Z419 Encounter for procedure for purposes other than remedying health state, unspecified: Secondary | ICD-10-CM | POA: Diagnosis not present

## 2021-10-12 ENCOUNTER — Encounter: Payer: Self-pay | Admitting: Family Medicine

## 2021-10-12 ENCOUNTER — Ambulatory Visit (INDEPENDENT_AMBULATORY_CARE_PROVIDER_SITE_OTHER): Payer: Medicaid Other | Admitting: Family Medicine

## 2021-10-12 VITALS — BP 144/86 | HR 67 | Temp 97.3°F | Ht 64.0 in | Wt 216.4 lb

## 2021-10-12 DIAGNOSIS — F418 Other specified anxiety disorders: Secondary | ICD-10-CM

## 2021-10-12 DIAGNOSIS — R7303 Prediabetes: Secondary | ICD-10-CM

## 2021-10-12 DIAGNOSIS — I10 Essential (primary) hypertension: Secondary | ICD-10-CM | POA: Diagnosis not present

## 2021-10-12 MED ORDER — HYDROCHLOROTHIAZIDE 25 MG PO TABS: 25.0000 mg | ORAL_TABLET | Freq: Every day | ORAL | 1 refills | Status: DC

## 2021-10-12 MED ORDER — FLUOXETINE HCL 20 MG PO CAPS: 20.0000 mg | ORAL_CAPSULE | Freq: Every day | ORAL | 3 refills | Status: DC

## 2021-10-12 MED ORDER — AMLODIPINE BESYLATE 5 MG PO TABS: 5.0000 mg | ORAL_TABLET | Freq: Every day | ORAL | 1 refills | Status: DC

## 2021-10-12 NOTE — Progress Notes (Signed)
Established Patient Office Visit  Subjective   Patient ID: Rita Joseph, female    DOB: 11-20-1980  Age: 41 y.o. MRN: [id number]  Chief Complaint  Patient presents with   Follow-up    5 week follow up feels like medications is causing swelling. Needing to know if she will need order to go to the gym through her insurance. Patient not fasting.     HPI for follow-up of depression with anxiety, hypertension and prediabetes.  Mood is definitely been elevated with the Prozac.  She can tell a difference.  She has more energy and willingness to do more.  Her insurance is willing to help her pay for gym membership.  She is not currently exercising at this time.  Continues with HCTZ without issue.  She did have gestational diabetes.    Review of Systems  Constitutional:  Negative for chills, diaphoresis, malaise/fatigue and weight loss.  HENT: Negative.    Eyes: Negative.  Negative for blurred vision and double vision.  Respiratory: Negative.    Cardiovascular:  Negative for chest pain.  Gastrointestinal:  Negative for abdominal pain.  Genitourinary: Negative.   Musculoskeletal:  Negative for falls and myalgias.  Neurological:  Negative for speech change, loss of consciousness and weakness.  Psychiatric/Behavioral: Negative.        Objective:     BP (!) 144/86 (BP Location: Left Arm, Patient Position: Sitting, Cuff Size: Large)   Pulse 67   Temp (!) 97.3 F (36.3 C) (Temporal)   Ht _0  (1.626 m)   Wt 216 lb 6.4 oz (98.2 kg)   SpO2 98%   BMI 37.14 kg/m  BP Readings from Last 3 Encounters:  10/12/21 (!) 144/86  09/06/21 120/78  01/13/21 133/82   Wt Readings from Last 3 Encounters:  10/12/21 216 lb 6.4 oz (98.2 kg)  09/06/21 216 lb 12.8 oz (98.3 kg)  01/13/21 213 lb (96.6 kg)      Physical Exam Constitutional:      General: She is not in acute distress.    Appearance: Normal appearance. She is not ill-appearing, toxic-appearing or diaphoretic.  HENT:     Head:  Normocephalic and atraumatic.     Right Ear: External ear normal.     Left Ear: External ear normal.     Mouth/Throat:     Mouth: Mucous membranes are moist.     Pharynx: Oropharynx is clear. No oropharyngeal exudate or posterior oropharyngeal erythema.  Eyes:     General: No scleral icterus.       Right eye: No discharge.        Left eye: No discharge.     Extraocular Movements: Extraocular movements intact.     Conjunctiva/sclera: Conjunctivae normal.     Pupils: Pupils are equal, round, and reactive to light.  Cardiovascular:     Rate and Rhythm: Normal rate and regular rhythm.  Pulmonary:     Effort: Pulmonary effort is normal. No respiratory distress.     Breath sounds: Normal breath sounds.  Musculoskeletal:     Cervical back: No rigidity or tenderness.  Skin:    General: Skin is warm and dry.  Neurological:     Mental Status: She is alert and oriented to person, place, and time.  Psychiatric:        Mood and Affect: Mood normal.        Behavior: Behavior normal.      No results found for any visits on 10/12/21.    The 10-year ASCVD  risk score (Arnett DK, et al., 2019) is: 3%    Assessment & Plan:   Problem List Items Addressed This Visit   None Visit Diagnoses     Essential hypertension    -  Primary   Relevant Medications   amLODipine (NORVASC) 5 MG tablet   hydrochlorothiazide (HYDRODIURIL) 25 MG tablet   Depression with anxiety       Relevant Medications   FLUoxetine (PROZAC) 20 MG capsule   Prediabetes           No follow-ups on file.  Understands the importance of losing weight to prevent further progression of diabetes and for blood pressure control.  Have added amlodipine.  We will start walking daily and consider a gym membership.  Continue the fluoxetine at 20 mg daily.  Information was given on preventing type 2 diabetes as well as exercising to lose weight.  She had met with a nutritionist during the pregnancy for her gestational diabetes and  knows what to do.  She admits it is a matter of doing it.  Libby Maw, MD

## 2021-10-29 DIAGNOSIS — Z419 Encounter for procedure for purposes other than remedying health state, unspecified: Secondary | ICD-10-CM | POA: Diagnosis not present

## 2021-11-29 DIAGNOSIS — Z419 Encounter for procedure for purposes other than remedying health state, unspecified: Secondary | ICD-10-CM | POA: Diagnosis not present

## 2021-12-07 ENCOUNTER — Telehealth: Payer: Medicaid Other | Admitting: Family Medicine

## 2021-12-29 DIAGNOSIS — Z419 Encounter for procedure for purposes other than remedying health state, unspecified: Secondary | ICD-10-CM | POA: Diagnosis not present

## 2022-01-29 DIAGNOSIS — Z419 Encounter for procedure for purposes other than remedying health state, unspecified: Secondary | ICD-10-CM | POA: Diagnosis not present

## 2022-02-08 ENCOUNTER — Ambulatory Visit: Payer: Medicaid Other | Admitting: Family Medicine

## 2022-02-08 ENCOUNTER — Telehealth: Payer: Self-pay | Admitting: Family Medicine

## 2022-02-08 NOTE — Telephone Encounter (Signed)
Pt was a no show 1/11 for OV with Dr. Ethelene Hal. First with him, letter sent

## 2022-02-15 ENCOUNTER — Encounter: Payer: Self-pay | Admitting: Family Medicine

## 2022-02-15 NOTE — Telephone Encounter (Signed)
No show 02/06/2021 and 02/08/2022  Medicaid - unable to generate fee Sent final warning letter via mail and [employer]

## 2022-03-01 DIAGNOSIS — Z419 Encounter for procedure for purposes other than remedying health state, unspecified: Secondary | ICD-10-CM | POA: Diagnosis not present

## 2022-03-30 DIAGNOSIS — Z419 Encounter for procedure for purposes other than remedying health state, unspecified: Secondary | ICD-10-CM | POA: Diagnosis not present

## 2022-04-30 DIAGNOSIS — Z419 Encounter for procedure for purposes other than remedying health state, unspecified: Secondary | ICD-10-CM | POA: Diagnosis not present

## 2022-05-16 ENCOUNTER — Telehealth: Payer: Self-pay | Admitting: Family Medicine

## 2022-05-16 NOTE — Telephone Encounter (Signed)
LVM for patient to call PCP to schedule follow up apt for DM, HTN, Mood. AS, CMA

## 2022-05-30 DIAGNOSIS — Z419 Encounter for procedure for purposes other than remedying health state, unspecified: Secondary | ICD-10-CM | POA: Diagnosis not present

## 2022-06-28 DIAGNOSIS — M545 Low back pain, unspecified: Secondary | ICD-10-CM | POA: Diagnosis not present

## 2022-06-28 DIAGNOSIS — Z8249 Family history of ischemic heart disease and other diseases of the circulatory system: Secondary | ICD-10-CM | POA: Diagnosis not present

## 2022-06-28 DIAGNOSIS — G8929 Other chronic pain: Secondary | ICD-10-CM | POA: Diagnosis not present

## 2022-06-28 DIAGNOSIS — Z6837 Body mass index (BMI) 37.0-37.9, adult: Secondary | ICD-10-CM | POA: Diagnosis not present

## 2022-06-28 DIAGNOSIS — Z833 Family history of diabetes mellitus: Secondary | ICD-10-CM | POA: Diagnosis not present

## 2022-06-28 DIAGNOSIS — I10 Essential (primary) hypertension: Secondary | ICD-10-CM | POA: Diagnosis not present

## 2022-06-30 DIAGNOSIS — Z419 Encounter for procedure for purposes other than remedying health state, unspecified: Secondary | ICD-10-CM | POA: Diagnosis not present

## 2022-07-30 DIAGNOSIS — Z419 Encounter for procedure for purposes other than remedying health state, unspecified: Secondary | ICD-10-CM | POA: Diagnosis not present

## 2022-08-20 ENCOUNTER — Telehealth: Payer: Medicaid Other | Admitting: Family Medicine

## 2022-08-30 DIAGNOSIS — Z419 Encounter for procedure for purposes other than remedying health state, unspecified: Secondary | ICD-10-CM | POA: Diagnosis not present

## 2022-09-30 DIAGNOSIS — Z419 Encounter for procedure for purposes other than remedying health state, unspecified: Secondary | ICD-10-CM | POA: Diagnosis not present

## 2022-10-30 DIAGNOSIS — Z419 Encounter for procedure for purposes other than remedying health state, unspecified: Secondary | ICD-10-CM | POA: Diagnosis not present

## 2022-11-14 ENCOUNTER — Other Ambulatory Visit: Payer: Self-pay | Admitting: Family Medicine

## 2022-11-14 DIAGNOSIS — I10 Essential (primary) hypertension: Secondary | ICD-10-CM

## 2022-11-30 DIAGNOSIS — Z419 Encounter for procedure for purposes other than remedying health state, unspecified: Secondary | ICD-10-CM | POA: Diagnosis not present

## 2022-12-30 DIAGNOSIS — Z419 Encounter for procedure for purposes other than remedying health state, unspecified: Secondary | ICD-10-CM | POA: Diagnosis not present

## 2023-01-30 DIAGNOSIS — Z419 Encounter for procedure for purposes other than remedying health state, unspecified: Secondary | ICD-10-CM | POA: Diagnosis not present

## 2023-01-30 NOTE — L&D Delivery Note (Signed)
    Rita Joseph is a 43 y.o. [id number] s/p VD at [redacted]w[redacted]d. She was admitted for IOL s/t fetal tachycardia and CHTN with SI PreEclampsia. 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  ROM: 9h 53m with clear fluid GBS Status: PRESUMPTIVE NEGATIVE/-- (12/01 2219) Maximum Maternal Temperature: 98.2   Labor Progress: Patient started on magnesium  sulfate infusion d/t severe blood pressures.  She received pitocin  infusion and progressed to 5.5cm.  At that time she was AROM with FSE and pitocin  augmentation continued. She progressed to complete dilation and delivered shortly after with staff and family support.   Delivery Date/Time: Wednesday January 01, 2024 at 0117 Delivery: Called to room and patient was complete. Head delivered in OA position with restitution to ROT. No nuchal cord present. Shoulders and body delivered easily and infant with good tone and spontaneous cry. Infant placed on mother's abdomen where nurse provided tactile stimulation. After 3 minute delay, the umbilical cord was clamped, cut by support person-Rita Joseph, and blood collected by provider. TXA initiated and after 5 minutes placenta delivered spontaneously via Keren and was noted to be intact with 3VC upon inspection.  Cord sample obtained and pitocin  initiated. Vaginal inspection revealed no lacerations.  Fundus firm, at the umbilicus, and bleeding small.  Reviewed immediate postpartum care including fundal checks, pain medication, magnesium  infusion continuation, and transfer to postpartum care.  Mother hemodynamically stable. Infant skin to skin with father.    Placenta: Disposal Complications:  -Labor/Delivery:CHTN with SI PreE -Prenatal: CHTN, GDMA1 Lacerations: None EBL: 32 Analgesia: Epidural  Postpartum Planning -Feeding: Bottle -Circumcision: N/A -Anticipated BCM: BTL -PP Message Sent -Discharge Summary Shared  Infant: Female-Rita Joseph  APGARs 9, 9  3210g-7lbs 1.2oz, 19.5in  Harlene LITTIE Duncans, CNM   01/01/2024 2:50 AM  Delivery Report: Review the Delivery Report for details.

## 2023-02-26 ENCOUNTER — Encounter (HOSPITAL_BASED_OUTPATIENT_CLINIC_OR_DEPARTMENT_OTHER): Payer: Self-pay | Admitting: Emergency Medicine

## 2023-02-26 ENCOUNTER — Emergency Department (HOSPITAL_BASED_OUTPATIENT_CLINIC_OR_DEPARTMENT_OTHER): Payer: Medicaid Other

## 2023-02-26 ENCOUNTER — Emergency Department (HOSPITAL_BASED_OUTPATIENT_CLINIC_OR_DEPARTMENT_OTHER)
Admission: EM | Admit: 2023-02-26 | Discharge: 2023-02-26 | Disposition: A | Discharge status: Home / Self Care | Payer: Medicaid Other | Attending: Emergency Medicine | Admitting: Emergency Medicine

## 2023-02-26 ENCOUNTER — Other Ambulatory Visit: Payer: Self-pay

## 2023-02-26 DIAGNOSIS — R0602 Shortness of breath: Secondary | ICD-10-CM | POA: Diagnosis not present

## 2023-02-26 DIAGNOSIS — R059 Cough, unspecified: Secondary | ICD-10-CM | POA: Diagnosis present

## 2023-02-26 DIAGNOSIS — R051 Acute cough: Secondary | ICD-10-CM

## 2023-02-26 DIAGNOSIS — R042 Hemoptysis: Secondary | ICD-10-CM | POA: Diagnosis not present

## 2023-02-26 DIAGNOSIS — Z79899 Other long term (current) drug therapy: Secondary | ICD-10-CM | POA: Insufficient documentation

## 2023-02-26 DIAGNOSIS — U071 COVID-19: Secondary | ICD-10-CM | POA: Diagnosis not present

## 2023-02-26 DIAGNOSIS — I10 Essential (primary) hypertension: Secondary | ICD-10-CM | POA: Insufficient documentation

## 2023-02-26 LAB — BASIC METABOLIC PANEL
Anion gap: 9 (ref 5–15)
BUN: 9 mg/dL (ref 6–20)
CO2: 23 mmol/L (ref 22–32)
Calcium: 8.9 mg/dL (ref 8.9–10.3)
Chloride: 100 mmol/L (ref 98–111)
Creatinine, Ser: 0.75 mg/dL (ref 0.44–1.00)
GFR, Estimated: >60 mL/min (ref >60)
Glucose, Bld: 135 mg/dL — ABNORMAL HIGH (ref 70–99)
Potassium: 3.3 mmol/L — ABNORMAL LOW (ref 3.5–5.1)
Sodium: 132 mmol/L — ABNORMAL LOW (ref 135–145)

## 2023-02-26 LAB — CBC WITH DIFFERENTIAL/PLATELET
Abs Immature Granulocytes: 0.03 10*3/uL (ref 0.00–0.07)
Basophils Absolute: 0 10*3/uL (ref 0.0–0.1)
Basophils Relative: 0 %
Eosinophils Absolute: 0 10*3/uL (ref 0.0–0.5)
Eosinophils Relative: 0 %
HCT: 43.6 % (ref 36.0–46.0)
Hemoglobin: 15.1 g/dL — ABNORMAL HIGH (ref 12.0–15.0)
Immature Granulocytes: 0 %
Lymphocytes Relative: 14 %
Lymphs Abs: 1.6 10*3/uL (ref 0.7–4.0)
MCH: 30.8 pg (ref 26.0–34.0)
MCHC: 34.6 g/dL (ref 30.0–36.0)
MCV: 89 fL (ref 80.0–100.0)
Monocytes Absolute: 1.1 10*3/uL — ABNORMAL HIGH (ref 0.1–1.0)
Monocytes Relative: 9 %
Neutro Abs: 9 10*3/uL — ABNORMAL HIGH (ref 1.7–7.7)
Neutrophils Relative %: 77 %
Platelets: 262 10*3/uL (ref 150–400)
RBC: 4.9 MIL/uL (ref 3.87–5.11)
RDW: 13.2 % (ref 11.5–15.5)
WBC: 11.7 10*3/uL — ABNORMAL HIGH (ref 4.0–10.5)
nRBC: 0 % (ref 0.0–0.2)

## 2023-02-26 LAB — RESP PANEL BY RT-PCR (RSV, FLU A&B, COVID)  RVPGX2
Influenza A by PCR: NEGATIVE
Influenza B by PCR: NEGATIVE
Resp Syncytial Virus by PCR: NEGATIVE
SARS Coronavirus 2 by RT PCR: POSITIVE — AB

## 2023-02-26 LAB — HCG, SERUM, QUALITATIVE: Preg, Serum: NEGATIVE

## 2023-02-26 MED ORDER — DM-GUAIFENESIN ER 30-600 MG PO TB12: 1.0000 | ORAL_TABLET | Freq: Two times a day (BID) | ORAL | 0 refills | Status: DC

## 2023-02-26 MED ORDER — ACETAMINOPHEN 500 MG PO TABS
1000.0000 mg | ORAL_TABLET | Freq: Once | ORAL | Status: AC
  Administered 2023-02-26: 1000 mg via ORAL
  Filled 2023-02-26: qty 2

## 2023-02-26 NOTE — Discharge Instructions (Addendum)
Thank you for letting us evaluate you today. You have been diagnosed with COVID. Please make sure to drink plenty of fluids, broth, smoothies, yogurt. You can use chloraseptic spray found otc for sore throat, honey, tea. F/U with your PCP in one week Return to ED if you experience chest pain, shortness of breath, significantly worsening of symptoms.

## 2023-02-26 NOTE — ED Notes (Signed)
Discharge instructions reviewed with patient. Patient verbalizes understanding, no further questions at this time. Medications/prescriptions and follow up information provided. No acute distress noted at time of departure.
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
## 2023-02-27 ENCOUNTER — Other Ambulatory Visit: Payer: Self-pay | Admitting: Family Medicine

## 2023-02-27 DIAGNOSIS — I10 Essential (primary) hypertension: Secondary | ICD-10-CM

## 2023-03-02 DIAGNOSIS — Z419 Encounter for procedure for purposes other than remedying health state, unspecified: Secondary | ICD-10-CM | POA: Diagnosis not present

## 2023-03-30 DIAGNOSIS — Z419 Encounter for procedure for purposes other than remedying health state, unspecified: Secondary | ICD-10-CM | POA: Diagnosis not present

## 2023-05-06 ENCOUNTER — Ambulatory Visit: Admitting: Family Medicine

## 2023-05-11 DIAGNOSIS — Z419 Encounter for procedure for purposes other than remedying health state, unspecified: Secondary | ICD-10-CM | POA: Diagnosis not present

## 2023-05-28 ENCOUNTER — Other Ambulatory Visit (HOSPITAL_COMMUNITY)
Admission: RE | Admit: 2023-05-28 | Discharge: 2023-05-28 | Disposition: A | Discharge status: Home / Self Care | Source: Ambulatory Visit | Attending: Family Medicine | Admitting: Family Medicine

## 2023-05-28 ENCOUNTER — Ambulatory Visit (INDEPENDENT_AMBULATORY_CARE_PROVIDER_SITE_OTHER): Admitting: Family Medicine

## 2023-05-28 VITALS — BP 144/102 | HR 96 | Temp 98.1°F | Ht 64.0 in | Wt 212.2 lb

## 2023-05-28 DIAGNOSIS — B9689 Other specified bacterial agents as the cause of diseases classified elsewhere: Secondary | ICD-10-CM | POA: Insufficient documentation

## 2023-05-28 DIAGNOSIS — N898 Other specified noninflammatory disorders of vagina: Secondary | ICD-10-CM | POA: Diagnosis not present

## 2023-05-28 DIAGNOSIS — R7303 Prediabetes: Secondary | ICD-10-CM | POA: Diagnosis not present

## 2023-05-28 DIAGNOSIS — I10 Essential (primary) hypertension: Secondary | ICD-10-CM | POA: Diagnosis not present

## 2023-05-28 DIAGNOSIS — N760 Acute vaginitis: Secondary | ICD-10-CM | POA: Insufficient documentation

## 2023-05-28 LAB — BASIC METABOLIC PANEL WITH GFR
BUN: 9 mg/dL (ref 6–23)
CO2: 26 meq/L (ref 19–32)
Calcium: 9.1 mg/dL (ref 8.4–10.5)
Chloride: 104 meq/L (ref 96–112)
Creatinine, Ser: 0.72 mg/dL (ref 0.40–1.20)
GFR: 103.51 mL/min (ref >60.00)
Glucose, Bld: 87 mg/dL (ref 70–99)
Potassium: 3.9 meq/L (ref 3.5–5.1)
Sodium: 135 meq/L (ref 135–145)

## 2023-05-28 LAB — HEMOGLOBIN A1C: Hgb A1c MFr Bld: 5.6 % (ref 4.6–6.5)

## 2023-05-28 MED ORDER — AMLODIPINE BESYLATE 5 MG PO TABS: 5.0000 mg | ORAL_TABLET | Freq: Every day | ORAL | 0 refills | Status: DC

## 2023-05-28 NOTE — Progress Notes (Addendum)
 Established Patient Office Visit   Subjective:  Patient ID: Rita Joseph, female    DOB: December 02, 1980  Age: 43 y.o. MRN: [id number]  Chief Complaint  Patient presents with   Medical Management of Chronic Issues    Pt is overdue for follow for for HTN.    vaginal odor    Pt complains of a vaginal odor    HPI Encounter Diagnoses  Name Primary?   Essential hypertension Yes   Prediabetes    Vaginal discharge    For follow-up of above.  Lost to follow-up for the last 2 to 3 years.  She is currently on no medications.  She has been able to lose some weight.  Recent pregnancies have been complicated by gestational diabetes and required a cervical cerclage.  She has a history of abnormal Pap smear.  Ongoing vaginal malodor since her last pregnancy.  She is married and in a stable relationship.  Denies dysuria.   Review of Systems  Constitutional: Negative.   HENT: Negative.    Eyes:  Negative for blurred vision, discharge and redness.  Respiratory: Negative.    Cardiovascular: Negative.   Gastrointestinal:  Negative for abdominal pain.  Genitourinary: Negative.  Negative for dysuria, frequency and urgency.  Musculoskeletal: Negative.  Negative for myalgias.  Skin:  Negative for rash.  Neurological:  Negative for tingling, loss of consciousness and weakness.  Endo/Heme/Allergies:  Negative for polydipsia.     Current Outpatient Medications:    amLODipine  (NORVASC ) 5 MG tablet, Take 1 tablet (5 mg total) by mouth daily., Disp: 90 each, Rfl: 0   metroNIDAZOLE  (FLAGYL ) 500 MG tablet, Take 1 tablet (500 mg total) by mouth 3 (three) times daily for 7 days. No alcohol while taking this prescription, Disp: 21 tablet, Rfl: 0   Objective:     BP (!) 144/102 (Patient Position: Sitting, Cuff Size: Normal)   Pulse 96   Temp 98.1 F (36.7 C) (Temporal)   Ht 5\' 4"  (1.626 m)   Wt 212 lb 3.2 oz (96.3 kg)   SpO2 100%   BMI 36.42 kg/m    Physical Exam Constitutional:      General:  She is not in acute distress.    Appearance: Normal appearance. She is not ill-appearing, toxic-appearing or diaphoretic.  HENT:     Head: Normocephalic and atraumatic.     Right Ear: External ear normal.     Left Ear: External ear normal.     Mouth/Throat:     Mouth: Mucous membranes are moist.     Pharynx: Oropharynx is clear. No oropharyngeal exudate or posterior oropharyngeal erythema.  Eyes:     General: No scleral icterus.       Right eye: No discharge.        Left eye: No discharge.     Extraocular Movements: Extraocular movements intact.     Conjunctiva/sclera: Conjunctivae normal.     Pupils: Pupils are equal, round, and reactive to light.  Cardiovascular:     Rate and Rhythm: Normal rate and regular rhythm.  Pulmonary:     Effort: Pulmonary effort is normal. No respiratory distress.     Breath sounds: Normal breath sounds. No wheezing, rhonchi or rales.  Musculoskeletal:     Cervical back: No rigidity or tenderness.  Lymphadenopathy:     Cervical: No cervical adenopathy.  Skin:    General: Skin is warm and dry.  Neurological:     Mental Status: She is alert and oriented to person, place, and time.  Psychiatric:        Mood and Affect: Mood normal.        Behavior: Behavior normal.      Results for orders placed or performed in visit on 05/28/23  Basic metabolic panel with GFR  Result Value Ref Range   Sodium 135 135 - 145 mEq/L   Potassium 3.9 3.5 - 5.1 mEq/L   Chloride 104 96 - 112 mEq/L   CO2 26 19 - 32 mEq/L   Glucose, Bld 87 70 - 99 mg/dL   BUN 9 6 - 23 mg/dL   Creatinine, Ser 1.61 0.40 - 1.20 mg/dL   GFR 096.04 >54.09 mL/min   Calcium 9.1 8.4 - 10.5 mg/dL  Hemoglobin W1X  Result Value Ref Range   Hgb A1c MFr Bld 5.6 4.6 - 6.5 %  Cervicovaginal ancillary only  Result Value Ref Range   Bacterial Vaginitis (gardnerella) Positive (A)    Candida Vaginitis Negative    Candida Glabrata Negative    Comment      Normal Reference Range Bacterial Vaginosis  - Negative   Comment Normal Reference Range Candida Species - Negative    Comment Normal Reference Range Candida Galbrata - Negative       The ASCVD Risk score (Arnett DK, et al., 2019) failed to calculate for the following reasons:   Cannot find a previous HDL lab   Cannot find a previous total cholesterol lab    Assessment & Plan:   Essential hypertension -     Basic metabolic panel with GFR -     amLODIPine  Besylate; Take 1 tablet (5 mg total) by mouth daily.  Dispense: 90 each; Refill: 0  Prediabetes -     Basic metabolic panel with GFR -     Hemoglobin A1c  Vaginal discharge -     Cervicovaginal ancillary only -     Cervicovaginal ancillary only -     metroNIDAZOLE ; Take 1 tablet (500 mg total) by mouth 3 (three) times daily for 7 days. No alcohol while taking this prescription  Dispense: 21 tablet; Refill: 0    Return in about 8 weeks (around 07/23/2023).  She is planning on scheduling follow-up with her GYN doctor for follow-up of contraception, follow-up of vaginal malodor and abnormal Pap smear.  Will start amlodipine  5 mg daily.  Pending results of A1c will likely start metformin.  Discussed side effects of abdominal cramping and loose stools in the past for most people.  Tonna Frederic, MD

## 2023-05-29 LAB — CERVICOVAGINAL ANCILLARY ONLY
Bacterial Vaginitis (gardnerella): POSITIVE — AB
Candida Glabrata: NEGATIVE
Candida Vaginitis: NEGATIVE
Comment: NEGATIVE
Comment: NEGATIVE
Comment: NEGATIVE

## 2023-05-30 MED ORDER — METRONIDAZOLE 500 MG PO TABS: 500.0000 mg | ORAL_TABLET | Freq: Three times a day (TID) | ORAL | 0 refills | Status: AC

## 2023-05-30 MED ORDER — METRONIDAZOLE 500 MG PO TABS: 500.0000 mg | ORAL_TABLET | Freq: Three times a day (TID) | ORAL | 0 refills | Status: DC

## 2023-05-30 NOTE — Addendum Note (Signed)
 Addended by: Christianna Cowman A on: 05/30/2023 11:30 AM   Modules accepted: Orders

## 2023-06-10 DIAGNOSIS — Z419 Encounter for procedure for purposes other than remedying health state, unspecified: Secondary | ICD-10-CM | POA: Diagnosis not present

## 2023-07-01 ENCOUNTER — Ambulatory Visit: Admitting: Family Medicine

## 2023-07-02 ENCOUNTER — Ambulatory Visit (INDEPENDENT_AMBULATORY_CARE_PROVIDER_SITE_OTHER): Admitting: Family Medicine

## 2023-07-02 ENCOUNTER — Encounter: Payer: Self-pay | Admitting: Family Medicine

## 2023-07-02 VITALS — BP 116/78 | HR 107 | Temp 97.5°F | Ht 64.0 in | Wt 213.6 lb

## 2023-07-02 DIAGNOSIS — Z3A01 Less than 8 weeks gestation of pregnancy: Secondary | ICD-10-CM | POA: Diagnosis not present

## 2023-07-02 DIAGNOSIS — N912 Amenorrhea, unspecified: Secondary | ICD-10-CM | POA: Diagnosis not present

## 2023-07-02 DIAGNOSIS — I10 Essential (primary) hypertension: Secondary | ICD-10-CM

## 2023-07-02 LAB — POCT URINE PREGNANCY: Preg Test, Ur: POSITIVE — AB

## 2023-07-02 NOTE — Progress Notes (Signed)
 Established Patient Office Visit   Subjective:  Patient ID: Rita Joseph, female    DOB: [birth year]  Age: 43 y.o. MRN: [id number]  Chief Complaint  Patient presents with   Amenorrhea    Pt is on office today for postive preganacy. Pt does not want to keep the child due to complications while carrying and delivering fetus.     HPI Encounter Diagnoses  Name Primary?   Amenorrhea Yes   Less than [redacted] weeks gestation of pregnancy    Essential hypertension    Follow-up of amenorrhea with an LMP of 3/29.  G6 2123.  Essentially all of her pregnancies have been complicated.  2 required cerclage.  Blood pressure is well-controlled with amlodipine .  She does not desire to continue with this pregnancy and needs routine GYN care.   Review of Systems  Constitutional: Negative.   HENT: Negative.    Eyes:  Negative for blurred vision, discharge and redness.  Respiratory: Negative.    Cardiovascular: Negative.   Gastrointestinal:  Negative for abdominal pain.  Genitourinary: Negative.   Musculoskeletal: Negative.  Negative for myalgias.  Skin:  Negative for rash.  Neurological:  Negative for tingling, loss of consciousness and weakness.  Endo/Heme/Allergies:  Negative for polydipsia.     Current Outpatient Medications:    amLODipine  (NORVASC ) 5 MG tablet, Take 1 tablet (5 mg total) by mouth daily., Disp: 90 each, Rfl: 0   Objective:      BP 116/78 (Cuff Size: Normal)   Pulse (!) 107   Temp (!) 97.5 F (36.4 C) (Temporal)   Ht 5\' 4"  (1.626 m)   Wt 213 lb 9.6 oz (96.9 kg)   LMP 04/27/2023 (Approximate)   SpO2 96%   BMI 36.66 kg/m  BP Readings from Last 3 Encounters:  07/02/23 116/78  05/28/23 (!) 144/102  02/26/23 (!) 132/95   Wt Readings from Last 3 Encounters:  07/02/23 213 lb 9.6 oz (96.9 kg)  05/28/23 212 lb 3.2 oz (96.3 kg)  02/26/23 216 lb 7.9 oz (98.2 kg)      Physical Exam Constitutional:      General: She is not in acute distress.    Appearance: Normal  appearance. She is not ill-appearing, toxic-appearing or diaphoretic.  HENT:     Head: Normocephalic and atraumatic.     Right Ear: External ear normal.     Left Ear: External ear normal.  Eyes:     General: No scleral icterus.       Right eye: No discharge.        Left eye: No discharge.     Extraocular Movements: Extraocular movements intact.     Conjunctiva/sclera: Conjunctivae normal.  Pulmonary:     Effort: Pulmonary effort is normal. No respiratory distress.  Skin:    General: Skin is warm and dry.  Neurological:     Mental Status: She is alert and oriented to person, place, and time.  Psychiatric:        Mood and Affect: Mood normal.        Behavior: Behavior normal.      Results for orders placed or performed in visit on 07/02/23  POCT urine pregnancy  Result Value Ref Range   Preg Test, Ur Positive (A) Negative      The ASCVD Risk score (Arnett DK, et al., 2019) failed to calculate for the following reasons:   Cannot find a previous HDL lab   Cannot find a previous total cholesterol lab    Assessment &  Plan:   Amenorrhea -     POCT urine pregnancy  Less than [redacted] weeks gestation of pregnancy -     Ambulatory referral to Obstetrics / Gynecology  Essential hypertension    Return in about 6 months (around 01/01/2024).  Urgent to OB/GYN.  Continue amlodipine .  Tonna Frederic, MD

## 2023-07-11 ENCOUNTER — Ambulatory Visit: Payer: Self-pay | Admitting: *Deleted

## 2023-07-11 ENCOUNTER — Other Ambulatory Visit (INDEPENDENT_AMBULATORY_CARE_PROVIDER_SITE_OTHER): Payer: Self-pay

## 2023-07-11 VITALS — BP 121/83 | HR 96 | Wt 214.4 lb

## 2023-07-11 DIAGNOSIS — O0991 Supervision of high risk pregnancy, unspecified, first trimester: Secondary | ICD-10-CM

## 2023-07-11 DIAGNOSIS — O0990 Supervision of high risk pregnancy, unspecified, unspecified trimester: Secondary | ICD-10-CM

## 2023-07-11 DIAGNOSIS — Z3A10 10 weeks gestation of pregnancy: Secondary | ICD-10-CM

## 2023-07-11 DIAGNOSIS — Z419 Encounter for procedure for purposes other than remedying health state, unspecified: Secondary | ICD-10-CM | POA: Diagnosis not present

## 2023-07-11 DIAGNOSIS — Z1331 Encounter for screening for depression: Secondary | ICD-10-CM

## 2023-07-11 NOTE — Progress Notes (Signed)
 New OB Intake  I connected with Boyce Byes  on 07/11/23 at 10:15 AM EDT by In Person Visit and verified that I am speaking with the correct person using two identifiers. Nurse is located at [facility] and pt is located at [place].  I discussed the limitations, risks, security and privacy concerns of performing an evaluation and management service by telephone and the availability of in person appointments. I also discussed with the patient that there may be a patient responsible charge related to this service. The patient expressed understanding and agreed to proceed.  I explained I am completing New OB Intake today. We discussed EDD of 02/01/2024, by Last Menstrual Period. Pt is [id number]. I reviewed her allergies, medications and Medical/Surgical/OB history.    Patient Active Problem List   Diagnosis Date Noted   History of preterm delivery, currently pregnant 09/05/2020   Incompetence of cervix 09/05/2020   Obesity (BMI 30-39.9) 09/05/2020   Vitamin D  deficiency 03/05/2019   Hypertension 02/06/2019     Concerns addressed today  Delivery Plans Plans to deliver at [facility]. Discussed the nature of our practice with multiple providers including residents and students. Due to the size of the practice, the delivering provider may not be the same as those providing prenatal care.   Patient is not a candidate for in water birth.  MyChart/Babyscripts MyChart access verified. I explained pt will have some visits in office and some virtually. Babyscripts instructions given and order placed. Patient verifies receipt of registration text/e-mail. Account successfully created and app downloaded. If patient is a candidate for Optimized scheduling, add to sticky note.   Blood Pressure Cuff/Weight Scale Blood pressure cuff ordered for patient to pick-up from [employer]. Explained after first prenatal appt pt will check weekly and document in Babyscripts. Patient does not have weight scale;  patient may purchase if they desire to track weight weekly in Babyscripts.  Anatomy US  Explained first scheduled US  will be around 19 weeks. Anatomy US  scheduled for TBD at [facility].  Is patient a candidate for Babyscripts Optimization? No, due to Risk Factors   First visit review I reviewed new OB appt with patient. Explained pt will be seen by [facility] at first visit. Discussed [facility] [place] genetic screening with patient. [facility].. Routine prenatal labs not collected. See OB sticky note.   Last Pap Diagnosis  Date Value Ref Range Status  03/04/2019 (A)  Final   - Atypical squamous cells, cannot exclude high grade squamous  03/04/2019 intraepithelial lesion (ASC-H) (A)  Final    Donette Furlong, RN 07/11/2023  5:27 PM

## 2023-07-23 ENCOUNTER — Ambulatory Visit: Admitting: Family Medicine

## 2023-07-23 ENCOUNTER — Telehealth: Admitting: Family Medicine

## 2023-07-23 DIAGNOSIS — R21 Rash and other nonspecific skin eruption: Secondary | ICD-10-CM

## 2023-07-23 NOTE — Progress Notes (Signed)
 For the safety of you and your child, I recommend a face to face office visit with a health care provider.  Many mothers need to take medicines during their pregnancy and while nursing.  Almost all medicines pass into the breast milk in small quantities.  Most are generally considered safe for a mother to take but some medicines must be avoided.  After reviewing your E-Visit request, I recommend that you consult your OB/GYN or pediatrician for medical advice in relation to your condition and prescription medications while pregnant or breastfeeding.  NOTE: There will be NO CHARGE for this E-Visit

## 2023-08-10 DIAGNOSIS — Z419 Encounter for procedure for purposes other than remedying health state, unspecified: Secondary | ICD-10-CM | POA: Diagnosis not present

## 2023-09-05 ENCOUNTER — Ambulatory Visit: Admitting: Family Medicine

## 2023-09-06 ENCOUNTER — Telehealth: Payer: Self-pay | Admitting: Family Medicine

## 2023-09-06 NOTE — Telephone Encounter (Signed)
 Dismissal generated. Sent via [employer].

## 2023-09-06 NOTE — Telephone Encounter (Signed)
 02/08/2022 no show 05/06/2023 same day cancel 07/23/2023 no show 09/05/2023 no show  Do you want to proceed with dismissal?

## 2023-09-10 DIAGNOSIS — Z419 Encounter for procedure for purposes other than remedying health state, unspecified: Secondary | ICD-10-CM | POA: Diagnosis not present

## 2023-09-26 ENCOUNTER — Encounter: Admitting: Obstetrics and Gynecology

## 2023-10-11 DIAGNOSIS — O0990 Supervision of high risk pregnancy, unspecified, unspecified trimester: Secondary | ICD-10-CM | POA: Insufficient documentation

## 2023-10-11 DIAGNOSIS — Z419 Encounter for procedure for purposes other than remedying health state, unspecified: Secondary | ICD-10-CM | POA: Diagnosis not present

## 2023-10-14 ENCOUNTER — Ambulatory Visit: Admitting: Obstetrics and Gynecology

## 2023-10-14 ENCOUNTER — Other Ambulatory Visit (HOSPITAL_COMMUNITY)
Admission: RE | Admit: 2023-10-14 | Discharge: 2023-10-14 | Disposition: A | Discharge status: Home / Self Care | Source: Ambulatory Visit | Attending: Obstetrics and Gynecology | Admitting: Obstetrics and Gynecology

## 2023-10-14 ENCOUNTER — Encounter: Payer: Self-pay | Admitting: Obstetrics and Gynecology

## 2023-10-14 VITALS — BP 130/85 | HR 112 | Wt 228.0 lb

## 2023-10-14 DIAGNOSIS — Z3A24 24 weeks gestation of pregnancy: Secondary | ICD-10-CM | POA: Insufficient documentation

## 2023-10-14 DIAGNOSIS — O10919 Unspecified pre-existing hypertension complicating pregnancy, unspecified trimester: Secondary | ICD-10-CM | POA: Insufficient documentation

## 2023-10-14 DIAGNOSIS — O09522 Supervision of elderly multigravida, second trimester: Secondary | ICD-10-CM | POA: Diagnosis not present

## 2023-10-14 DIAGNOSIS — O99210 Obesity complicating pregnancy, unspecified trimester: Secondary | ICD-10-CM | POA: Insufficient documentation

## 2023-10-14 DIAGNOSIS — O09292 Supervision of pregnancy with other poor reproductive or obstetric history, second trimester: Secondary | ICD-10-CM | POA: Insufficient documentation

## 2023-10-14 DIAGNOSIS — O0990 Supervision of high risk pregnancy, unspecified, unspecified trimester: Secondary | ICD-10-CM | POA: Diagnosis not present

## 2023-10-14 DIAGNOSIS — Z3482 Encounter for supervision of other normal pregnancy, second trimester: Secondary | ICD-10-CM

## 2023-10-14 DIAGNOSIS — Z8632 Personal history of gestational diabetes: Secondary | ICD-10-CM

## 2023-10-14 DIAGNOSIS — O09299 Supervision of pregnancy with other poor reproductive or obstetric history, unspecified trimester: Secondary | ICD-10-CM | POA: Insufficient documentation

## 2023-10-14 MED ORDER — METRONIDAZOLE 500 MG PO TABS: 500.0000 mg | ORAL_TABLET | Freq: Two times a day (BID) | ORAL | 0 refills | Status: DC

## 2023-10-14 MED ORDER — AMLODIPINE BESYLATE 5 MG PO TABS: 5.0000 mg | ORAL_TABLET | Freq: Every day | ORAL | 6 refills | Status: DC

## 2023-10-14 MED ORDER — SELENIUM SULFIDE 2.5 % EX LOTN: 1.0000 | TOPICAL_LOTION | Freq: Every day | CUTANEOUS | 12 refills | Status: AC | PRN

## 2023-10-14 MED ORDER — ASPIRIN 81 MG PO TBEC: 81.0000 mg | DELAYED_RELEASE_TABLET | Freq: Every day | ORAL | 2 refills | Status: AC

## 2023-10-14 NOTE — Progress Notes (Signed)
 Pt presents for new ob. Pt has no questions or concerns at this time.

## 2023-10-14 NOTE — Progress Notes (Signed)
 Subjective:    Rita Joseph is a [id number] [redacted]w[redacted]d being seen today for her first obstetrical visit.  Her obstetrical history is significant for advanced maternal age and , maternal obesity, all prior pregnancies with GDM, CHTN on norvasc , history of incompetent cervix with first 2 pregnancies, followed by term pregnancies with cerclage. Patient did not want a cerclage with this pregnancy. Patient does intend to breast feed. Pregnancy history fully reviewed.  Patient reports no complaints.  Vitals:   10/14/23 1521  BP: 130/85  Pulse: (!) 112  Weight: 228 lb (103.4 kg)    HISTORY: OB History  Gravida Para Term Preterm AB Living  6 4 2 2 1 3   SAB IAB Ectopic Multiple Live Births  1    4    # Outcome Date GA Lbr Len/2nd Weight Sex Type Anes PTL Lv  6 Current           5 SAB 12/2021     SAB     4 Term 01/19/21    M Vag-Spont   LIV     Complications: Postpartum hemorrhage  3 Term 05/04/14    F Vag-Spont   LIV  2 Preterm 12/03/12    M Vag-Spont   ND  1 Preterm 09/25/01    F Vag-Spont   LIV   Past Medical History:  Diagnosis Date   Hypertension    Past Surgical History:  Procedure Laterality Date   CERVICAL CERCLAGE     Family History  Problem Relation Age of Onset   Diabetes Father    Hypertension Father      Exam    Uterus:  Fundal Height: 24 cm  Pelvic Exam:    Perineum: Normal Perineum   Vulva: normal   Vagina:  normal mucosa, normal discharge   pH:    Cervix: multiparous appearance and closed/thick   Adnexa: not evaluated   Bony Pelvis: gynecoid  System: Breast:  normal appearance, no masses or tenderness   Skin: normal coloration and turgor, no rashes, dermatitis noted: on chest    Neurologic: oriented, no focal deficits   Extremities: normal strength, tone, and muscle mass   HEENT extra ocular movement intact   Mouth/Teeth mucous membranes moist, pharynx normal without lesions and dental hygiene good   Neck supple and no masses   Cardiovascular:  regular rate and rhythm   Respiratory:  appears well, vitals normal, no respiratory distress, acyanotic, normal RR, chest clear, no wheezing, crepitations, rhonchi, normal symmetric air entry   Abdomen: soft, non-tender; bowel sounds normal; no masses,  no organomegaly   Urinary:       Assessment:    Pregnancy: [id number] Patient Active Problem List   Diagnosis Date Noted   AMA (advanced maternal age) multigravida 35+, second trimester 10/14/2023   Maternal obesity affecting pregnancy, antepartum 10/14/2023   Chronic hypertension affecting pregnancy 10/14/2023   History of gestational diabetes in prior pregnancy, currently pregnant 10/14/2023   History of postpartum hemorrhage, currently pregnant in second trimester 10/14/2023   Supervision of high risk pregnancy, antepartum 10/11/2023   History of preterm delivery, currently pregnant 09/05/2020   Incompetence of cervix 09/05/2020   Obesity (BMI 30-39.9) 09/05/2020   Vitamin D  deficiency 03/05/2019   Hypertension 02/06/2019        Plan:     Initial labs drawn. Prenatal vitamins. Problem list reviewed and updated. Genetic Screening discussed : ordered.  Ultrasound discussed; fetal survey: ordered. Patient currently on norvasc  and HCTZ for [facility]- patient instructed to discontinue hydrochlorothiazide   Rx ASA provided Baseline labs ordered  Follow up in 4 weeks, fasting for glucola 50% of 30 min visit spent on counseling and coordination of care.     Vallery Mcdade 10/14/2023

## 2023-10-14 NOTE — Patient Instructions (Signed)
 Second Trimester of Pregnancy  The second trimester of pregnancy is from week 14 through week 27. This is months 4 through 6 of pregnancy. During the second trimester: Morning sickness is less or has stopped. You may have more energy. You may feel hungry more often. At this time, your unborn baby is growing very fast. At the end of the sixth month, the unborn baby may be up to 12 inches long and weigh about 1 pounds. You will likely start to feel the baby move between 16 and 20 weeks of pregnancy. Body changes during your second trimester Your body continues to change during this time. The changes usually go away after your baby is born. Physical changes You will gain more weight. Your belly will get bigger. You may begin to get stretch marks on your hips, belly, and breasts. Your breasts will keep growing and may hurt. You may get dark spots or blotches on your face. A dark line from your belly button to the pubic area may appear. This line is called linea nigra. Your hair may grow faster and get thicker. Health changes You may have headaches. You may have heartburn. You may pee more often. You may have swollen, bulging veins (varicose veins). You may have trouble pooping (constipation), or swollen veins in the butt that can itch or get painful (hemorrhoids). You may have back pain. This is caused by: Weight gain. Pregnancy hormones that are relaxing the joints in your pelvis. Follow these instructions at home: Medicines Talk to your health care provider if you're taking medicines. Ask if the medicines are safe to take during pregnancy. Your provider may change the medicines that you take. Do not take any medicines unless told to by your provider. Take a prenatal vitamin that has at least 600 micrograms (mcg) of folic acid. Do not use herbal medicines, illegal drugs, or medicines that are not approved by your provider. Eating and drinking While you're pregnant your body needs  extra food for your growing baby. Talk with your provider about what to eat while pregnant. Activity Most women are able to exercise during pregnancy. Exercises may need to change as your pregnancy goes on. Talk to your provider about your activities and exercise routines. Relieving pain and discomfort Wear a good, supportive bra if your breasts hurt. Rest with your legs raised if you have leg cramps or low back pain. Take warm sitz baths to soothe pain from hemorrhoids. Use hemorrhoid cream if your provider says it's okay. Do not douche. Do not use tampons or scented pads. Do not use hot tubs, steam rooms, or saunas. Safety Wear your seatbelt at all times when you're in a car. Talk to your provider if someone hits you, hurts you, or yells at you. Talk with your provider if you're feeling sad or have thoughts of hurting yourself. Lifestyle Certain things can be harmful while you're pregnant. It's best to avoid the following: Do not drink alcohol,smoke, vape, or use products with nicotine or tobacco in them. If you need help quitting, talk with your provider. Avoid cat litter boxes and soil used by cats. These things carry germs that can cause harm to your pregnancy and your baby. General instructions Keep all follow-up visits. It helps you and your unborn baby stay as healthy as possible. Write down your questions. Take them to your prenatal visits. Your provider will: Talk with you about your overall health. Give you advice or refer you to specialists who can help with different needs,  including: Prenatal education classes. Mental health and counseling. Foods and healthy eating. Ask for help if you need help with food. Where to find more information American Pregnancy Association: americanpregnancy.org [employer]stetricians [employer] Gynecologists: acog.org Office on [employer] Health: [employer] Contact a health care provider if: You have a headache that does not go away  when you take medicine. You have any of these problems: You can't eat or drink. You throw up or feel like you may throw up. You have watery poop (diarrhea) for 2 days or more. You have pain when you pee or your pee smells bad. You have been sick for 2 days or more and are not getting better. Contact your provider right away if: You have any of these coming from your vagina: Abnormal discharge. Bad-smelling fluid. Bleeding. Your baby is moving less than usual. You have contractions, belly cramping, or have pain in your pelvis or lower back. You have symptoms of high blood pressure or preeclampsia. These include: A severe, throbbing headache that does not go away. Sudden or extreme swelling of your face, hands, legs, or feet. Vision problems: You see spots. You have blurry vision. Your eyes are sensitive to light. If you can't reach the provider, go to an urgent care or emergency room. Get help right away if: You faint, become confused, or can't think clearly. You have chest pain or trouble breathing. You have any kind of injury, such as from a fall or a car crash. These symptoms may be an emergency. Call 911 right away. Do not wait to see if the symptoms will go away. Do not drive yourself to the hospital. This information is not intended to replace advice given to you by your health care provider. Make sure you discuss any questions you have with your health care provider. Document Revised: 10/18/2022 Document Reviewed: 05/18/2022 Elsevier Patient Education  2024 [employer].

## 2023-10-15 ENCOUNTER — Ambulatory Visit: Payer: Self-pay | Admitting: Obstetrics and Gynecology

## 2023-10-15 DIAGNOSIS — O0990 Supervision of high risk pregnancy, unspecified, unspecified trimester: Secondary | ICD-10-CM

## 2023-10-15 DIAGNOSIS — O24419 Gestational diabetes mellitus in pregnancy, unspecified control: Secondary | ICD-10-CM | POA: Insufficient documentation

## 2023-10-15 LAB — CBC/D/PLT+RPR+RH+ABO+RUBIGG...
Antibody Screen: NEGATIVE
Basophils Absolute: 0 x10E3/uL (ref 0.0–0.2)
Basos: 0 %
EOS (ABSOLUTE): 0.1 x10E3/uL (ref 0.0–0.4)
Eos: 1 %
HCV Ab: NONREACTIVE
HIV Screen 4th Generation wRfx: NONREACTIVE
Hematocrit: 37.8 % (ref 34.0–46.6)
Hemoglobin: 13.2 g/dL (ref 11.1–15.9)
Hepatitis B Surface Ag: NEGATIVE
Immature Grans (Abs): 0.1 x10E3/uL (ref 0.0–0.1)
Immature Granulocytes: 1 %
Lymphocytes Absolute: 1.8 x10E3/uL (ref 0.7–3.1)
Lymphs: 15 %
MCH: 33.1 pg — ABNORMAL HIGH (ref 26.6–33.0)
MCHC: 34.9 g/dL (ref 31.5–35.7)
MCV: 95 fL (ref 79–97)
Monocytes Absolute: 0.8 x10E3/uL (ref 0.1–0.9)
Monocytes: 7 %
Neutrophils Absolute: 9 x10E3/uL — ABNORMAL HIGH (ref 1.4–7.0)
Neutrophils: 76 %
Platelets: 233 x10E3/uL (ref 150–450)
RBC: 3.99 x10E6/uL (ref 3.77–5.28)
RDW: 12.9 % (ref 11.7–15.4)
RPR Ser Ql: NONREACTIVE
Rh Factor: POSITIVE
Rubella Antibodies, IGG: 8.6 {index} (ref >0.99)
WBC: 11.8 x10E3/uL — ABNORMAL HIGH (ref 3.4–10.8)

## 2023-10-15 LAB — CERVICOVAGINAL ANCILLARY ONLY
Bacterial Vaginitis (gardnerella): POSITIVE — AB
Candida Glabrata: NEGATIVE
Candida Vaginitis: NEGATIVE
Chlamydia: NEGATIVE
Comment: NEGATIVE
Comment: NEGATIVE
Comment: NEGATIVE
Comment: NEGATIVE
Comment: NEGATIVE
Comment: NORMAL
Neisseria Gonorrhea: NEGATIVE
Trichomonas: NEGATIVE

## 2023-10-15 LAB — COMPREHENSIVE METABOLIC PANEL WITH GFR
ALT: 14 IU/L (ref 0–32)
AST: 10 IU/L (ref 0–40)
Albumin: 3.5 g/dL — ABNORMAL LOW (ref 3.9–4.9)
Alkaline Phosphatase: 77 IU/L (ref 41–116)
BUN/Creatinine Ratio: 14 (ref 9–23)
BUN: 9 mg/dL (ref 6–24)
Bilirubin Total: 0.3 mg/dL (ref 0.0–1.2)
CO2: 18 mmol/L — ABNORMAL LOW (ref 20–29)
Calcium: 9.1 mg/dL (ref 8.7–10.2)
Chloride: 101 mmol/L (ref 96–106)
Creatinine, Ser: 0.64 mg/dL (ref 0.57–1.00)
Globulin, Total: 2.5 g/dL (ref 1.5–4.5)
Glucose: 193 mg/dL — ABNORMAL HIGH (ref 70–99)
Potassium: 4 mmol/L (ref 3.5–5.2)
Sodium: 133 mmol/L — ABNORMAL LOW (ref 134–144)
Total Protein: 6 g/dL (ref 6.0–8.5)
eGFR: 113 mL/min/1.73 (ref >59)

## 2023-10-15 LAB — HEMOGLOBIN A1C
Est. average glucose Bld gHb Est-mCnc: 146 mg/dL
Hgb A1c MFr Bld: 6.7 % — ABNORMAL HIGH (ref 4.8–5.6)

## 2023-10-15 LAB — PROTEIN / CREATININE RATIO, URINE
Creatinine, Urine: 156.4 mg/dL
Protein, Ur: 59.2 mg/dL
Protein/Creat Ratio: 379 mg/g{creat} — ABNORMAL HIGH (ref 0–200)

## 2023-10-15 LAB — HCV INTERPRETATION

## 2023-10-16 LAB — URINE CULTURE, OB REFLEX

## 2023-10-16 LAB — CULTURE, OB URINE

## 2023-10-16 MED ORDER — GLUCOSE BLOOD VI STRP: ORAL_STRIP | 12 refills | Status: AC

## 2023-10-16 MED ORDER — ACCU-CHEK SOFTCLIX LANCETS MISC: 12 refills | Status: AC

## 2023-10-16 MED ORDER — ACCU-CHEK GUIDE W/DEVICE KIT: 1.0000 | PACK | Freq: Four times a day (QID) | 0 refills | Status: AC

## 2023-10-17 LAB — CYTOLOGY - PAP
Comment: NEGATIVE
Diagnosis: NEGATIVE
Diagnosis: REACTIVE
High risk HPV: NEGATIVE

## 2023-10-20 LAB — PANORAMA PRENATAL TEST FULL PANEL:PANORAMA TEST PLUS 5 ADDITIONAL MICRODELETIONS: FETAL FRACTION: 5.5

## 2023-10-22 ENCOUNTER — Telehealth: Payer: Self-pay

## 2023-10-22 NOTE — Telephone Encounter (Signed)
 RESCHEDULED DUE TO NO [facility] 10/29

## 2023-10-25 LAB — HORIZON 4 (SMA, CF, FRAGILE X, DMD)
CYSTIC FIBROSIS: POSITIVE — AB
DUCHENNE/BECKER MUSCULAR DYSTROPHY: NEGATIVE
FRAGILE X SYNDROME: NEGATIVE
REPORT SUMMARY: POSITIVE — AB
SPINAL MUSCULAR ATROPHY: NEGATIVE

## 2023-10-30 ENCOUNTER — Encounter: Payer: Self-pay | Admitting: Obstetrics and Gynecology

## 2023-10-30 DIAGNOSIS — Z141 Cystic fibrosis carrier: Secondary | ICD-10-CM | POA: Insufficient documentation

## 2023-11-10 DIAGNOSIS — Z419 Encounter for procedure for purposes other than remedying health state, unspecified: Secondary | ICD-10-CM | POA: Diagnosis not present

## 2023-11-11 ENCOUNTER — Other Ambulatory Visit: Payer: Self-pay

## 2023-11-11 ENCOUNTER — Ambulatory Visit: Admitting: Physician Assistant

## 2023-11-11 ENCOUNTER — Encounter: Payer: Self-pay | Admitting: Physician Assistant

## 2023-11-11 VITALS — BP 119/79 | HR 108 | Wt 229.8 lb

## 2023-11-11 DIAGNOSIS — O24419 Gestational diabetes mellitus in pregnancy, unspecified control: Secondary | ICD-10-CM | POA: Diagnosis not present

## 2023-11-11 DIAGNOSIS — Z3A28 28 weeks gestation of pregnancy: Secondary | ICD-10-CM | POA: Diagnosis not present

## 2023-11-11 DIAGNOSIS — Z1331 Encounter for screening for depression: Secondary | ICD-10-CM | POA: Diagnosis not present

## 2023-11-11 DIAGNOSIS — E669 Obesity, unspecified: Secondary | ICD-10-CM | POA: Diagnosis not present

## 2023-11-11 DIAGNOSIS — O0990 Supervision of high risk pregnancy, unspecified, unspecified trimester: Secondary | ICD-10-CM

## 2023-11-11 DIAGNOSIS — O09522 Supervision of elderly multigravida, second trimester: Secondary | ICD-10-CM

## 2023-11-11 DIAGNOSIS — N883 Incompetence of cervix uteri: Secondary | ICD-10-CM

## 2023-11-11 DIAGNOSIS — O10919 Unspecified pre-existing hypertension complicating pregnancy, unspecified trimester: Secondary | ICD-10-CM | POA: Diagnosis not present

## 2023-11-11 MED ORDER — METFORMIN HCL 500 MG PO TABS: 500.0000 mg | ORAL_TABLET | Freq: Two times a day (BID) | ORAL | 5 refills | Status: DC

## 2023-11-11 NOTE — Progress Notes (Signed)
   PRENATAL VISIT NOTE  Subjective:  Rita Joseph is a 43 y.o. [phone] at [redacted]w[redacted]d being seen today for ongoing prenatal care.  She is currently monitored for the following issues for this high-risk pregnancy and has Hypertension; Vitamin D  deficiency; History of preterm delivery, currently pregnant; Incompetence of cervix; Obesity (BMI 30-39.9); Supervision of high risk pregnancy, antepartum; AMA (advanced maternal age) multigravida 35+, second trimester; Maternal obesity affecting pregnancy, antepartum; Chronic hypertension affecting pregnancy; History of gestational diabetes in prior pregnancy, currently pregnant; History of postpartum hemorrhage, currently pregnant in second trimester; Gestational diabetes mellitus (GDM) affecting pregnancy, antepartum; and Cystic fibrosis carrier on their problem list.  Patient reports no complaints.  Contractions: Irritability. Vag. Bleeding: None.  Movement: Present. Denies leaking of fluid.   The following portions of the patient's history were reviewed and updated as appropriate: allergies, current medications, past family history, past medical history, past social history, past surgical history and problem list.   Objective:    Vitals:   11/11/23 0827  BP: 119/79  Pulse: (!) 108  Weight: 229 lb 12.8 oz (104.2 kg)    Fetal Status:  Fetal Heart Rate (bpm): 157 Fundal Height: 30 cm Movement: Present    General: Alert, oriented and cooperative. Patient is in no acute distress.  Skin: Skin is warm and dry. No rash noted.   Cardiovascular: Normal heart rate noted  Respiratory: Normal respiratory effort, no problems with respiration noted  Abdomen: Soft, gravid, appropriate for gestational age.  Pain/Pressure: Present     Pelvic: Cervical exam deferred        Extremities: Normal range of motion.  Edema: Trace  Mental Status: Normal mood and affect. Normal behavior. Normal judgment and thought content.   Assessment and Plan:  Pregnancy: H3E7786 at  [redacted]w[redacted]d  1. Supervision of high risk pregnancy, antepartum (Primary) Patient doing well, feeling regular fetal movement BP, FHR, FH appropriate   2. [redacted] weeks gestation of pregnancy Anticipatory guidance about next visits/weeks of pregnancy given.   3. Gestational diabetes mellitus (GDM) affecting pregnancy, antepartum Based on A1c 6.7% Patient has not been logging but reports daily fastings in 200's. She has been taking post-prandials right after eating in stead of 2 hr mark. Will send metformin to take at night. Reviewed instructions for taking BG. Log given.   4. Chronic hypertension affecting pregnancy Continue ASA Normotensive on Norvasc  5 mg daily  Baseline labs ~24 weeks with protein/creatinine 379. Normal CMP and CBC.  Anatomy scan 12/02/23 Delivery at 39 weeks  5. Incompetence of cervix Patient declined cerclage this pregnancy  6. AMA (advanced maternal age) multigravida 35+, second trimester 7. Obesity (BMI 30-39.9) Continue ASA  8. Positive Depression Screening We discussed in detail the issues that are impacting her mental health this pregnancy. No SI/HI. Accepts IBH referral.   Preterm labor symptoms and general obstetric precautions including but not limited to vaginal bleeding, contractions, leaking of fluid and fetal movement were reviewed in detail with the patient.  Please refer to After Visit Summary for other counseling recommendations.   No follow-ups on file.  Future Appointments  Date Time Provider Department Center  12/02/2023  2:00 PM [facility] PROVIDER 1 [facility]  12/02/2023  2:30 PM [facility] US4 [facility]    Jorene FORBES Moats, PA-C

## 2023-11-11 NOTE — Progress Notes (Signed)
 Pt presents for ROB visit. C/o vaginal pressure. Declines Tdap

## 2023-11-18 ENCOUNTER — Other Ambulatory Visit: Payer: Self-pay

## 2023-11-18 DIAGNOSIS — Z141 Cystic fibrosis carrier: Secondary | ICD-10-CM

## 2023-11-25 ENCOUNTER — Ambulatory Visit: Admitting: Obstetrics and Gynecology

## 2023-11-25 VITALS — BP 128/77 | HR 116 | Wt 231.4 lb

## 2023-11-25 DIAGNOSIS — O24419 Gestational diabetes mellitus in pregnancy, unspecified control: Secondary | ICD-10-CM

## 2023-11-25 DIAGNOSIS — Z141 Cystic fibrosis carrier: Secondary | ICD-10-CM

## 2023-11-25 DIAGNOSIS — Z3A30 30 weeks gestation of pregnancy: Secondary | ICD-10-CM | POA: Diagnosis not present

## 2023-11-25 DIAGNOSIS — O09522 Supervision of elderly multigravida, second trimester: Secondary | ICD-10-CM

## 2023-11-25 DIAGNOSIS — O0990 Supervision of high risk pregnancy, unspecified, unspecified trimester: Secondary | ICD-10-CM | POA: Diagnosis not present

## 2023-11-25 DIAGNOSIS — M549 Dorsalgia, unspecified: Secondary | ICD-10-CM | POA: Diagnosis not present

## 2023-11-25 DIAGNOSIS — O10919 Unspecified pre-existing hypertension complicating pregnancy, unspecified trimester: Secondary | ICD-10-CM

## 2023-11-25 DIAGNOSIS — O99891 Other specified diseases and conditions complicating pregnancy: Secondary | ICD-10-CM | POA: Diagnosis not present

## 2023-11-25 DIAGNOSIS — O09523 Supervision of elderly multigravida, third trimester: Secondary | ICD-10-CM | POA: Diagnosis not present

## 2023-11-25 MED ORDER — DEXCOM G7 SENSOR MISC: 1.0000 | 6 refills | Status: DC

## 2023-11-25 MED ORDER — CYCLOBENZAPRINE HCL 10 MG PO TABS: 10.0000 mg | ORAL_TABLET | Freq: Three times a day (TID) | ORAL | 1 refills | Status: AC | PRN

## 2023-11-25 NOTE — Progress Notes (Signed)
   PRENATAL VISIT NOTE  Subjective:  Rita Joseph is a 43 y.o. [phone] at [redacted]w[redacted]d being seen today for ongoing prenatal care.  She is currently monitored for the following issues for this high-risk pregnancy and has Vitamin D  deficiency; History of preterm delivery, currently pregnant; Incompetence of cervix; Obesity (BMI 30-39.9); Supervision of high risk pregnancy, antepartum; AMA (advanced maternal age) multigravida 35+, second trimester; Maternal obesity affecting pregnancy, antepartum; Chronic hypertension affecting pregnancy; History of gestational diabetes in prior pregnancy, currently pregnant; History of postpartum hemorrhage, currently pregnant in second trimester; Gestational diabetes mellitus (GDM) affecting pregnancy, antepartum; and Cystic fibrosis carrier on their problem list.  Patient reports hard time sleeping due to overall body discomforts, cannot get comfortable at night .  Contractions: Irritability. Vag. Bleeding: None.  Movement: Present. Denies leaking of fluid.   The following portions of the patient's history were reviewed and updated as appropriate: allergies, current medications, past family history, past medical history, past social history, past surgical history and problem list.   Objective:    Vitals:   11/25/23 0920  BP: 128/77  Pulse: (!) 116  Weight: 231 lb 6.4 oz (105 kg)    Fetal Status:  Fetal Heart Rate (bpm): 164   Movement: Present    General: Alert, oriented and cooperative. Patient is in no acute distress.  Skin: Skin is warm and dry. No rash noted.   Cardiovascular: Normal heart rate noted  Respiratory: Normal respiratory effort, no problems with respiration noted  Abdomen: Soft, gravid, appropriate for gestational age.  Pain/Pressure: Present     Pelvic: Cervical exam deferred        Extremities: Normal range of motion.  Edema: Trace  Mental Status: Normal mood and affect. Normal behavior. Normal judgment and thought content.   Assessment  and Plan:  Pregnancy: [id number] at [redacted]w[redacted]d 1. Supervision of high risk pregnancy, antepartum (Primary) Expressed that she does not desire to keep baby after delivery, will reach out to navigator to get in touch with her regarding options   2. Cystic fibrosis carrier   3. Gestational diabetes mellitus (GDM) affecting pregnancy, antepartum On metfromin BID  Does not have lancet anymore to check sugar, however would be interested in Dexcom, sample provided today and prescription sent  Discussed logging sugars and send a message on Thursday or Friday with results to assess need to titrate meds  U/s coming up on 11/3   4. Chronic hypertension affecting pregnancy Has not started ASA, discussed recommendation for prevention or delaying onset of pre-e with hx of CHTN Novrvasc 5mg  daily  Anatomy scan   5. AMA (advanced maternal age) multigravida 35+, second trimester Anatomy u/s 11/3 LR NIPS  Preterm labor symptoms and general obstetric precautions including but not limited to vaginal bleeding, contractions, leaking of fluid and fetal movement were reviewed in detail with the patient. Please refer to After Visit Summary for other counseling recommendations.   Return in about 2 weeks (around 12/09/2023) for OB VISIT (MD or APP).  Future Appointments  Date Time Provider Department Center  12/02/2023  1:00 PM [facility] GENETIC COUNSELING RM [facility]  12/02/2023  2:00 PM [facility] PROVIDER 1 [facility]  12/02/2023  2:30 PM [facility] US4 [facility]  12/12/2023 11:15 AM Nicholaus Pippin E, PA-C [facility] None  12/25/2023 10:15 AM Constant, Winton, MD [facility] None    Nidia Daring, FNP

## 2023-11-25 NOTE — Progress Notes (Signed)
 Pt presents for ROB visit. Pt unable to check blood sugar due to lost device. Declines Flu and Tdap

## 2023-11-26 LAB — COMPREHENSIVE METABOLIC PANEL WITH GFR
ALT: 19 IU/L (ref 0–32)
AST: 17 IU/L (ref 0–40)
Albumin: 3.3 g/dL — ABNORMAL LOW (ref 3.9–4.9)
Alkaline Phosphatase: 79 IU/L (ref 41–116)
BUN/Creatinine Ratio: 8 — ABNORMAL LOW (ref 9–23)
BUN: 6 mg/dL (ref 6–24)
Bilirubin Total: 0.4 mg/dL (ref 0.0–1.2)
CO2: 17 mmol/L — ABNORMAL LOW (ref 20–29)
Calcium: 8.7 mg/dL (ref 8.7–10.2)
Chloride: 101 mmol/L (ref 96–106)
Creatinine, Ser: 0.71 mg/dL (ref 0.57–1.00)
Globulin, Total: 2.3 g/dL (ref 1.5–4.5)
Glucose: 230 mg/dL — ABNORMAL HIGH (ref 70–99)
Potassium: 4 mmol/L (ref 3.5–5.2)
Sodium: 134 mmol/L (ref 134–144)
Total Protein: 5.6 g/dL — ABNORMAL LOW (ref 6.0–8.5)
eGFR: 109 mL/min/1.73 (ref >59)

## 2023-11-26 LAB — CBC
Hematocrit: 37.5 % (ref 34.0–46.6)
Hemoglobin: 12.4 g/dL (ref 11.1–15.9)
MCH: 30.8 pg (ref 26.6–33.0)
MCHC: 33.1 g/dL (ref 31.5–35.7)
MCV: 93 fL (ref 79–97)
Platelets: 203 x10E3/uL (ref 150–450)
RBC: 4.02 x10E6/uL (ref 3.77–5.28)
RDW: 12.7 % (ref 11.7–15.4)
WBC: 9 x10E3/uL (ref 3.4–10.8)

## 2023-11-26 LAB — HIV ANTIBODY (ROUTINE TESTING W REFLEX): HIV Screen 4th Generation wRfx: NONREACTIVE

## 2023-11-26 LAB — RPR: RPR Ser Ql: NONREACTIVE

## 2023-11-27 ENCOUNTER — Other Ambulatory Visit

## 2023-11-27 ENCOUNTER — Ambulatory Visit

## 2023-11-27 ENCOUNTER — Telehealth: Payer: Self-pay

## 2023-11-27 ENCOUNTER — Ambulatory Visit: Payer: Self-pay | Admitting: Obstetrics and Gynecology

## 2023-11-27 NOTE — Telephone Encounter (Signed)
 Prior auth sent through cover my meds for pt Dexcom rx

## 2023-12-02 ENCOUNTER — Ambulatory Visit: Attending: Obstetrics and Gynecology | Admitting: Obstetrics

## 2023-12-02 ENCOUNTER — Other Ambulatory Visit: Payer: Self-pay | Admitting: *Deleted

## 2023-12-02 ENCOUNTER — Ambulatory Visit (HOSPITAL_BASED_OUTPATIENT_CLINIC_OR_DEPARTMENT_OTHER)

## 2023-12-02 ENCOUNTER — Ambulatory Visit

## 2023-12-02 VITALS — BP 126/85 | HR 115

## 2023-12-02 DIAGNOSIS — O09523 Supervision of elderly multigravida, third trimester: Secondary | ICD-10-CM | POA: Insufficient documentation

## 2023-12-02 DIAGNOSIS — O10913 Unspecified pre-existing hypertension complicating pregnancy, third trimester: Secondary | ICD-10-CM | POA: Diagnosis not present

## 2023-12-02 DIAGNOSIS — Z141 Cystic fibrosis carrier: Secondary | ICD-10-CM

## 2023-12-02 DIAGNOSIS — O0990 Supervision of high risk pregnancy, unspecified, unspecified trimester: Secondary | ICD-10-CM

## 2023-12-02 DIAGNOSIS — O3663X0 Maternal care for excessive fetal growth, third trimester, not applicable or unspecified: Secondary | ICD-10-CM | POA: Insufficient documentation

## 2023-12-02 DIAGNOSIS — O99213 Obesity complicating pregnancy, third trimester: Secondary | ICD-10-CM | POA: Insufficient documentation

## 2023-12-02 DIAGNOSIS — O24415 Gestational diabetes mellitus in pregnancy, controlled by oral hypoglycemic drugs: Secondary | ICD-10-CM

## 2023-12-02 DIAGNOSIS — O24414 Gestational diabetes mellitus in pregnancy, insulin controlled: Secondary | ICD-10-CM | POA: Diagnosis not present

## 2023-12-02 DIAGNOSIS — Z7984 Long term (current) use of oral hypoglycemic drugs: Secondary | ICD-10-CM | POA: Insufficient documentation

## 2023-12-02 DIAGNOSIS — O09292 Supervision of pregnancy with other poor reproductive or obstetric history, second trimester: Secondary | ICD-10-CM

## 2023-12-02 DIAGNOSIS — Z3A31 31 weeks gestation of pregnancy: Secondary | ICD-10-CM | POA: Diagnosis not present

## 2023-12-02 DIAGNOSIS — O09522 Supervision of elderly multigravida, second trimester: Secondary | ICD-10-CM

## 2023-12-02 DIAGNOSIS — O09893 Supervision of other high risk pregnancies, third trimester: Secondary | ICD-10-CM

## 2023-12-02 DIAGNOSIS — O09299 Supervision of pregnancy with other poor reproductive or obstetric history, unspecified trimester: Secondary | ICD-10-CM

## 2023-12-02 DIAGNOSIS — O10919 Unspecified pre-existing hypertension complicating pregnancy, unspecified trimester: Secondary | ICD-10-CM

## 2023-12-02 DIAGNOSIS — Z3A24 24 weeks gestation of pregnancy: Secondary | ICD-10-CM | POA: Diagnosis not present

## 2023-12-02 DIAGNOSIS — O24419 Gestational diabetes mellitus in pregnancy, unspecified control: Secondary | ICD-10-CM

## 2023-12-02 DIAGNOSIS — O99210 Obesity complicating pregnancy, unspecified trimester: Secondary | ICD-10-CM

## 2023-12-02 NOTE — Progress Notes (Signed)
 MFM Consult Note  Rita Joseph is currently at 31 weeks and 2 days.  She was seen due to advanced maternal age (43 years old), maternal obesity with a BMI of 39, chronic hypertension treated with amlodipine , and gestational diabetes treated with metformin 500 mg twice a day.    She currently has a continuous glucose monitor in place.  She reports that her blood glucose values remain elevated in the 180s to 270s range.  She reports that most of her prior children have required NICU admissions following delivery.  Her largest child was delivered at term weighing 11 pounds.  She was able to deliver that child vaginally without any issues.  She reports that she started prenatal care late as she did not want to deal with the frequent appointments that would have resulted due to her underlying medical conditions.  Her blood pressure today was 126/85.  She had a cell free DNA test earlier in her pregnancy which indicated a low risk for trisomy 30, 66, and 13. A female fetus is predicted.   Her [facility] screening test indicated that she is a carrier for cystic fibrosis.  She reports that her husband was screened a long time ago and he is not a carrier for cystic fibrosis.  Sonographic findings Single intrauterine pregnancy at 31w 2d.  Fetal cardiac activity:  Observed and appears normal. Presentation: Cephalic. The views of the fetal anatomy were limited today due to her advanced gestational age.  What was visualized today appeared within normal limits. Fetal biometry shows the estimated fetal weight of 5 pounds 10 ounces which measures at > 99th percentile. Amniotic fluid volume: Within normal limits.  AFI: 15.9 cm.  MVP: 7.43 cm. Placenta: Anterior. Fetal movements were noted throughout today's exam.  The patient was informed that anomalies may be missed due to technical limitations. If the fetus is in a suboptimal position or maternal habitus is increased, visualization of the fetus in the  maternal uterus may be impaired.  Uncontrolled gestational diabetes The implications and management of diabetes in pregnancy was discussed in detail with the patient.   She was advised that our goals for her blood glucose values are fasting values of 90-95 or less and two-hour postprandial values of 120 or less.   As her blood glucose levels remain elevated, her metformin dose may have to be increased or she may have to be started on insulin to help her achieve better glycemic control. The patient was advised that the large for gestational age fetus noted today (greater than 99th percentile) is an indicator that her glycemic control is poor.   The increased risk of adverse pregnancy outcomes such as a fetal demise due to uncontrolled gestational diabetes was discussed. We will continue to follow her with weekly fetal testing until delivery.  We will consider twice weekly testing if her glycemic control remains poor. Should her glycemic control remain poor, to avoid an adverse outcome such as a fetal demise, delivery may be considered at around 36 weeks. She reports that she is not concerned about a NICU admission as all of her prior children have required NICU admissions.  Advanced maternal age and chronic hypertension She should continue taking amlodipine  as prescribed for blood pressure control.   The increased risk of superimposed preeclampsia was discussed.  Preeclampsia precautions were reviewed today.   We will continue to assess her blood pressures and for signs of preeclampsia during her weekly visits. Fetal kick count instructions were reviewed.  The patient was  encouraged to sign her tubal ligation paperwork as soon as possible.  A BPP was scheduled in 1 week.    The patient stated that all of her questions were answered today.  A total of 45 minutes was spent counseling and coordinating the care for this patient.  Greater than 50% of the time was spent in direct face-to-face  contact.

## 2023-12-02 NOTE — Progress Notes (Unsigned)
 [facility] for Maternal [facility]etal [facility] at [facility] [address] Phone:  [phone]   Fax:  [phone]      In-Person Genetic Counseling Clinic Note:   I spoke with 43 y.o. Rita Joseph today to discuss her carrier screening results. She was referred by Rita Joseph LABOR, MD.   Pregnancy History:    [id number]. EGA: [redacted]w[redacted]d by LMP. EDD: 02/01/2024. Rita Joseph has two daughters and one son. She had a son who passed shortly after delivery; he was born preterm. She had an SAB at 4 months gestation of unknown etiology. Personal history of GDM and HTN. Denies other major personal health concerns. Denies bleeding, infections, and fevers in this pregnancy. Denies using tobacco, alcohol, or street drugs in this pregnancy.   Family History:    A three-generation pedigree was created and scanned into Epic under the Media tab.  Patient reports her maternal half-sister has Down syndrome. She currently lives with the patient's mother. We discussed that most cases of Down syndrome occur by chance. However, without medical and genetic testing reports, familial causes to Down syndrome or the possibility of another genetic condition cannot be entirely ruled out. Rita Joseph was reassured that her NIPS was low-risk for Down syndrome. If she learns of additional information regarding her family member's diagnosis, we are happy to revisit and discuss further.   Patient ethnicity reported as Black and FOB ethnicity reported as Black. Denies Ashkenazi Jewish ancestry.  Family history not remarkable for consanguinity, individuals with birth defects, intellectual disability, autism spectrum disorder, multiple spontaneous abortions, still births, or unexplained neonatal death.   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 Rita Joseph was not found to be a carrier for Duchenne muscular dystrophy, fragile X syndrome, or spinal muscular atrophy. Please see report for details. A negative result on carrier screening reduces but does not eliminate the chance of being a carrier.  We reviewed genes, autosomal recessive inheritance of CF, and the clinical features of CF. We reviewed there would be a 25% chance the pregnancy would be affected if FOB is also found to be a carrier. If both members of a couple are known to be carriers, diagnostic testing through amniocentesis is available to determine if the pregnancy is affected. The benefits, risks, and limitations of amniocentesis including the 1 in 500 risk for preterm delivery were reviewed. We also discussed that CF is included on [place] 's Newborn Screening Program. The newborn screening test utilizes trypsinogen levels rather than genetic testing to detect affected individuals whereas genetic testing can determine the specific variants, if any, that a child may have inherited. Therefore, the couple may also seek postnatal genetic counseling and testing if needed or desired.  Rita Joseph stated FOB had previously completed CF carrier screening, likely in [place]  during the previous pregnancies. She reports he is not a carrier. As we do not have records to review, we offered him confirmatory screening. Rita Joseph declined any additional testing. We reviewed if he is not a carrier, there would be a very low chance for this couple's pregnancies to be affected with CF.  Moreover, Rita Joseph was not screened for alpha thalassemia or beta-hemoglobinopathies per ACOG recommendations during this pregnancy. I also did not find any previous records. Therefore, we offered carrier screening for these conditions, and she declined.  Newborn Screening. The  [place]  Newborn Screening (NBS) program will screen all newborn babies for cystic fibrosis, spinal muscular atrophy,  hemoglobinopathies, and numerous other conditions.  Advanced Maternal Age:  We briefly discussed that the chance that a fetus would be affected with a chromosome difference increases with advanced maternal age. Rita Joseph's current age-related risk to have a pregnancy affected with a chromosome difference is approximately 1 in 39 (~2.5%). We discussed that this risk may also be lower given her low-risk NIPS results.  We discussed and offered the option of amniocentesis. We also discussed that testing can be performed postnatally if there is concern for a chromosomal condition in the newborn. Rita Joseph declined amniocentesis.  Previous Testing Completed:  Low risk NIPS: Rita Joseph previously completed [facility] noninvasive prenatal screening (NIPS) in this pregnancy. The result is low risk, consistent with a female fetus. This screening significantly reduces but does not eliminate the chance that the current pregnancy has Down syndrome (trisomy 67), trisomy 37, trisomy 88, common sex chromosome conditions, and 22q11.2 microdeletion syndrome. Please see report for details. There are many genetic conditions that cannot be detected by NIPS.    Plan of Care:   Declined FOB carrier screening. Declined carrier screening for herself for hemoglobinopathies. Declined amniocentesis. Upcoming [facility] appointments to be scheduled.   Informed consent was obtained. All questions were answered.   80 minutes were spent on the date of the encounter in service to the patient including preparation, face-to-face consultation, discussion of test reports and available next steps, pedigree construction, genetic risk assessment, documentation, and care coordination.    Thank you for sharing in the care of Rita Joseph with us .  Please do not hesitate to contact us  at [phone] if you have any questions.   [name], MS, [facility] Certified Genetic Counselor   Genetic counseling student involved in appointment: No.

## 2023-12-05 ENCOUNTER — Ambulatory Visit: Payer: Self-pay

## 2023-12-09 ENCOUNTER — Ambulatory Visit: Attending: Obstetrics and Gynecology | Admitting: *Deleted

## 2023-12-09 DIAGNOSIS — O24415 Gestational diabetes mellitus in pregnancy, controlled by oral hypoglycemic drugs: Secondary | ICD-10-CM | POA: Diagnosis not present

## 2023-12-09 DIAGNOSIS — O99213 Obesity complicating pregnancy, third trimester: Secondary | ICD-10-CM | POA: Diagnosis not present

## 2023-12-09 DIAGNOSIS — O09523 Supervision of elderly multigravida, third trimester: Secondary | ICD-10-CM | POA: Insufficient documentation

## 2023-12-09 DIAGNOSIS — Z3A32 32 weeks gestation of pregnancy: Secondary | ICD-10-CM | POA: Insufficient documentation

## 2023-12-09 NOTE — Procedures (Signed)
 Rita Joseph 24-Apr-1980 [redacted]w[redacted]d  Fetus A Non-Stress Test Interpretation for 12/09/23  Indication: Gestational Diabetes medication controlled, Advanced Maternal Age >40 years, and Obesity  Fetal Heart Rate A Mode: External Baseline Rate (A): 155 bpm Variability: Moderate Accelerations: 15 x 15 Decelerations: None Multiple birth?: No  Uterine Activity Mode: Toco Contraction Frequency (min): None Resting Tone Palpated: Relaxed  Interpretation (Fetal Testing) Nonstress Test Interpretation: Reactive Comments: Tracing reviewed by Dr. Ileana

## 2023-12-12 ENCOUNTER — Ambulatory Visit (INDEPENDENT_AMBULATORY_CARE_PROVIDER_SITE_OTHER): Admitting: Physician Assistant

## 2023-12-12 ENCOUNTER — Encounter: Payer: Self-pay | Admitting: Physician Assistant

## 2023-12-12 VITALS — BP 126/78 | HR 124 | Wt 232.6 lb

## 2023-12-12 DIAGNOSIS — O99213 Obesity complicating pregnancy, third trimester: Secondary | ICD-10-CM | POA: Diagnosis not present

## 2023-12-12 DIAGNOSIS — O09893 Supervision of other high risk pregnancies, third trimester: Secondary | ICD-10-CM

## 2023-12-12 DIAGNOSIS — O10919 Unspecified pre-existing hypertension complicating pregnancy, unspecified trimester: Secondary | ICD-10-CM

## 2023-12-12 DIAGNOSIS — Z3A32 32 weeks gestation of pregnancy: Secondary | ICD-10-CM

## 2023-12-12 DIAGNOSIS — O99210 Obesity complicating pregnancy, unspecified trimester: Secondary | ICD-10-CM

## 2023-12-12 DIAGNOSIS — N883 Incompetence of cervix uteri: Secondary | ICD-10-CM | POA: Diagnosis not present

## 2023-12-12 DIAGNOSIS — O0990 Supervision of high risk pregnancy, unspecified, unspecified trimester: Secondary | ICD-10-CM

## 2023-12-12 DIAGNOSIS — O09523 Supervision of elderly multigravida, third trimester: Secondary | ICD-10-CM | POA: Diagnosis not present

## 2023-12-12 DIAGNOSIS — O0993 Supervision of high risk pregnancy, unspecified, third trimester: Secondary | ICD-10-CM | POA: Diagnosis not present

## 2023-12-12 DIAGNOSIS — O09899 Supervision of other high risk pregnancies, unspecified trimester: Secondary | ICD-10-CM

## 2023-12-12 DIAGNOSIS — O24419 Gestational diabetes mellitus in pregnancy, unspecified control: Secondary | ICD-10-CM

## 2023-12-12 DIAGNOSIS — O10913 Unspecified pre-existing hypertension complicating pregnancy, third trimester: Secondary | ICD-10-CM | POA: Diagnosis not present

## 2023-12-12 DIAGNOSIS — O09522 Supervision of elderly multigravida, second trimester: Secondary | ICD-10-CM

## 2023-12-12 MED ORDER — DEXCOM G7 SENSOR MISC
1.0000 | 6 refills | Status: AC
Start: 2023-12-12 — End: ?

## 2023-12-12 NOTE — Progress Notes (Signed)
 PRENATAL VISIT NOTE  Subjective:  Rita Joseph is a 43 y.o. [phone] at [redacted]w[redacted]d being seen today for ongoing prenatal care.  She is currently monitored for the following issues for this high-risk pregnancy and has Vitamin D  deficiency; History of preterm delivery, currently pregnant; Incompetence of cervix; Obesity (BMI 30-39.9); Supervision of high risk pregnancy, antepartum; AMA (advanced maternal age) multigravida 35+, second trimester; Maternal obesity affecting pregnancy, antepartum; Chronic hypertension affecting pregnancy; History of gestational diabetes in prior pregnancy, currently pregnant; History of postpartum hemorrhage, currently pregnant in second trimester; Gestational diabetes mellitus (GDM) affecting pregnancy, antepartum; and Cystic fibrosis carrier on their problem list.  Patient reports unable to get dexcom at pharmacy, so no log of Bps. She reports that most of her 2 hr postprandials are getting up into high 200's.     Contractions: Irritability. Vag. Bleeding: None.  Movement: Present. Denies leaking of fluid.   The following portions of the patient's history were reviewed and updated as appropriate: allergies, current medications, past family history, past medical history, past social history, past surgical history and problem list.   Objective:   Vitals:   12/12/23 1133  BP: 126/78  Pulse: (!) 124  Weight: 232 lb 9.6 oz (105.5 kg)    Fetal Status:  Fetal Heart Rate (bpm): 160   Movement: Present    General: Alert, oriented and cooperative. Patient is in no acute distress.  Skin: Skin is warm and dry. No rash noted.   Cardiovascular: Normal heart rate noted  Respiratory: Normal respiratory effort, no problems with respiration noted  Abdomen: Soft, gravid, appropriate for gestational age.  Pain/Pressure: Present     Pelvic: Cervical exam deferred        Extremities: Normal range of motion.     Mental Status: Normal mood and affect. Normal behavior. Normal  judgment and thought content.      11/11/2023    8:32 AM 10/14/2023    3:28 PM 07/11/2023   10:54 AM  Depression screen PHQ 2/9  Decreased Interest 3 1 1   Down, Depressed, Hopeless 3 0 3  PHQ - 2 Score 6 1 4   Altered sleeping 3 0 3  Tired, decreased energy 3 2 2   Change in appetite 3 0 2  Feeling bad or failure about yourself  0 0 0  Trouble concentrating 0 0 0  Moving slowly or fidgety/restless 0 0 0  Suicidal thoughts 0 0 0  PHQ-9 Score 15  3  11       Data saved with a previous flowsheet row definition        11/11/2023    8:34 AM 10/14/2023    3:30 PM 07/11/2023   11:00 AM 09/06/2021   11:38 AM  GAD 7 : Generalized Anxiety Score  Nervous, Anxious, on Edge 0 0 2 2  Control/stop worrying 3 0 3 2  Worry too much - different things 0 0 3 2  Trouble relaxing 3 1 1 1   Restless 3 0 1 0  Easily annoyed or irritable 3 0 1 2  Afraid - awful might happen 1 0 0 0  Total GAD 7 Score 13 1 11 9   Anxiety Difficulty    Somewhat difficult    Assessment and Plan:  Pregnancy: H3E7786 at [redacted]w[redacted]d  1. Supervision of high risk pregnancy, antepartum (Primary) Patient doing well, feeling regular fetal movement BP, FHR, FH appropriate   2. [redacted] weeks gestation of pregnancy Anticipatory guidance about next visits/weeks of pregnancy given.   3. Gestational  diabetes mellitus (GDM) affecting pregnancy, antepartum Uncontrolled on metformin 500 mg twice daily, will increase to 1000 mg BID since patient strongly prefers over insulin. 12/02/23 EFW: 2557 gm ( > 99 %), AFI WNL Delivery at 36 weeks Continue weekly antenatal testing   4. Chronic hypertension affecting pregnancy On amlodipine  5 mg daily Continue ASA  5. Incompetence of cervix Patient declines cerclage this pregnancy  6. History of preterm delivery, currently pregnant No ssxs PTL  7. AMA (advanced maternal age) multigravida 35+, second trimester 8. Obesity affecting pregnancy, antepartum, unspecified obesity type Continue  ASA  Preterm labor symptoms and general obstetric precautions including but not limited to vaginal bleeding, contractions, leaking of fluid and fetal movement were reviewed in detail with the patient.  Please refer to After Visit Summary for other counseling recommendations.   No follow-ups on file.  Future Appointments  Date Time Provider Department Center  12/17/2023  1:15 PM Sherrine Marcelyn LITTIE ISRAEL [facility] None  12/23/2023 10:45 AM [facility] NST [facility]  12/25/2023 10:15 AM Constant, Peggy, MD [facility] None  12/30/2023 10:45 AM [facility] NST [facility]  01/07/2024 11:15 AM [facility] PROVIDER 1 [facility]  01/07/2024 11:30 AM [facility] US4 [facility]    Jorene FORBES Moats, PA-C

## 2023-12-12 NOTE — Progress Notes (Signed)
 Pt presents for ROB visit. Pt was unable to get Dexcom. States pharmacy says no Rx sent. Wants to know induction date.

## 2023-12-16 ENCOUNTER — Encounter: Payer: Self-pay | Admitting: Physician Assistant

## 2023-12-16 ENCOUNTER — Ambulatory Visit

## 2023-12-16 NOTE — Patient Instructions (Signed)
 How can I share my dexcom sensor data with my healthcare team? You can now easily share your data with your healthcare team through the [facility] sensor app or a web browser. Obtain the [facility] Code from your healthcare provider and follow these simple steps.   Within the [facility] G7 App: 1. Open your Dexcom sensor app on your smartphone 2. Tap the Connections Tab  3. Tap [facility] and tap "Continue" 4. Enter the [employer] Code when prompted. 5. 5. Confirm clinic details and tap "Done" when finished.  The code you will enter is: Chiropodist

## 2023-12-17 ENCOUNTER — Ambulatory Visit (INDEPENDENT_AMBULATORY_CARE_PROVIDER_SITE_OTHER): Payer: Self-pay | Admitting: Licensed Clinical Social Worker

## 2023-12-17 DIAGNOSIS — F32A Depression, unspecified: Secondary | ICD-10-CM | POA: Diagnosis not present

## 2023-12-17 DIAGNOSIS — O99343 Other mental disorders complicating pregnancy, third trimester: Secondary | ICD-10-CM

## 2023-12-17 NOTE — BH Specialist Note (Signed)
 Integrated Behavioral Health via Telemedicine Visit  12/23/2023 Rita Joseph [id number]  Number of Integrated Behavioral Health Clinician visits: 1- Initial Visit  Session Start time: 1315   Session End time: 1447  Total time in minutes: 92    Referring Provider: Dr. Zina Patient/Family location: Home [facility] Provider location: Remote Office All persons participating in visit: Patient and [name] Types of Service: Individual psychotherapy and Video visit  I connected with Rita Joseph and/or Rita List patient via  Telephone or Engineer, Civil (consulting)  (Video is Caregility application) and verified that I am speaking with the correct person using two identifiers. Discussed confidentiality: Yes   I discussed the limitations of telemedicine and the availability of in person appointments.  Discussed there is a possibility of technology failure and discussed alternative modes of communication if that failure occurs.  I discussed that engaging in this telemedicine visit, they consent to the provision of behavioral healthcare and the services will be billed under their insurance.  Patient and/or legal guardian expressed understanding and consented to Telemedicine visit: Yes   Presenting Concerns: Patient and/or family reports the following symptoms/concerns: Perinatal depression Duration of problem: Months; Severity of problem: moderate  Patient and/or Family's Strengths/Protective Factors: Social and Emotional competence, Concrete supports in place (healthy food, safe environments, etc.), Physical Health (exercise, healthy diet, medication compliance, etc.), and Caregiver has knowledge of parenting & child development  Goals Addressed: Patient will:  Reduce symptoms of: anxiety and depression   Increase knowledge and/or ability of: coping skills and healthy habits   Demonstrate ability to: Increase healthy adjustment to current life circumstances and Increase  adequate support systems for patient/family  Progress towards Goals: Ongoing    Interventions: Interventions utilized:  Mindfulness or Management Consultant, Supportive Counseling, Psychoeducation and/or Health Education, and Supportive Reflection Standardized Assessments completed: Not Needed    Patient and/or Family Response: The patient attended today's virtual session and explored the complex emotions connected to her pregnancy history, including multiple complicated and traumatic births, the loss of a child, and medically difficult pregnancies that have contributed to her current sense of denial and overwhelm regarding this pregnancy. She described long-standing patterns of limited mobility during pregnancy and shared that she continues to feel most comfortable resting in bed during the current pregnancy, as this was a mandatory requirement in previous high-risk pregnancies. The patient also processed family dynamics, including discussions about whether to place the baby for adoption or parent the child, noting that these considerations have been emotionally difficult for her husband given his own history of being placed with relatives as an infant. Additionally, she and her husband have discussed enrolling their toddler in daycare to help reduce her mental load, and she plans to make a list of questions for daycare providers and reach out next week to schedule tours.   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 She is also navigating complex family dynamics and decision-making regarding parenting, which has intensified emotional stress for both her and her husband..   Patient may benefit from continued support of integrated  behavioral health services.  Plan: Follow up with behavioral health clinician on : 12/25/2023 Behavioral recommendations: continued emotional support and processing of pregnancy-related trauma, exploring coping strategies to manage stress and anxiety, and gradual engagement in safe, manageable physical activity as  tolerated. Additionally, planning and organizing support systems, such as daycare for her toddler and prenatal care questions, are encouraged to reduce mental load and promote well-being. Referral(s): Integrated [employer] (In Clinic)  I discussed the assessment and treatment plan with the patient and/or parent/guardian. They were provided an opportunity to ask questions and all were answered. They agreed with the plan and demonstrated an understanding of the instructions.   They were advised to call back or seek an in-person evaluation if the symptoms worsen or if the condition fails to improve as anticipated.  Rita Joseph, LCSWA

## 2023-12-22 NOTE — BH Specialist Note (Incomplete)
 Integrated Behavioral Health via Telemedicine Visit  12/22/2023 Crislyn Willbanks [id number]  Number of Integrated Behavioral Health Clinician visits: 1- Initial Visit  Session Start time: 1315   Session End time: 1447  Total time in minutes: 92    Referring Provider: Dr. Zina Patient/Family location: Home [facility] Provider location: Remote Office All persons participating in visit: Patient and [facility] Types of Service: Individual psychotherapy and Video visit  I connected with [name] and/or Mylinda List patient via  Telephone or Engineer, Civil (consulting)  (Video is Caregility application) and verified that I am speaking with the correct person using two identifiers. Discussed confidentiality: Yes   I discussed the limitations of telemedicine and the availability of in person appointments.  Discussed there is a possibility of technology failure and discussed alternative modes of communication if that failure occurs.  I discussed that engaging in this telemedicine visit, they consent to the provision of behavioral healthcare and the services will be billed under their insurance.  Patient and/or legal guardian expressed understanding and consented to Telemedicine visit: Yes   Presenting Concerns: Patient and/or family reports the following symptoms/concerns: Perinatal depression Duration of problem: Months; Severity of problem: moderate  Patient and/or Family's Strengths/Protective Factors: Social and Emotional competence, Concrete supports in place (healthy food, safe environments, etc.), Physical Health (exercise, healthy diet, medication compliance, etc.), and Caregiver has knowledge of parenting & child development  Goals Addressed: Patient will:  Reduce symptoms of: anxiety and depression   Increase knowledge and/or ability of: coping skills and healthy habits   Demonstrate ability to: Increase healthy adjustment to current life circumstances and Increase  adequate support systems for patient/family  Progress towards Goals: Ongoing    Interventions: Interventions utilized:  Mindfulness or Relaxation Training, Supportive Counseling, Psychoeducation and/or Health Education, and Supportive Reflection Standardized Assessments completed: Not Needed    Patient and/or Family Response: Patient was present for today's virtual session. She reports she is the mother of three living children, 64, 92 and 47 year old. Before 42 year old she was pregnant with a son and he passed away. She's never had a pregnancy that went well for her. 23 due November 3rd but born August 28th. Second baby a boy who passed away while she was in labor. She was 6 months pregnant. Her now 42 year old, her water bag was hanging out after care- she went full term but had some complications while in labor. Stayed in the NICU for 30 days and could not touch her. Really bad experience when she came home- cried really bad when being touched. After this experience she and husband agreed no more children, got on birth control but still ended up getting pregnant with her 42 year old. Very hard pregnancy. Was not happy, didn't want to start over but sister was pregnant with her and this made the experience more enjoyable. Did experience a postpartum hemorrhage with him. Currently pregnant again, went this entire pregnancy without telling anyone until a few weeks ago. She's in denial about having this baby. Just started having doctor appointments with first one being in late September.    Desire was to give baby away. Husbands mother gave him away to his grandmother. Husband just found out he had a sister 3 years younger than him who he has never met. Does not want to go through this again.   Comfortable with laying in the bed all day and doing nothing. Do not feel comfortable with walking around. Who pregnancy sit and lay.. body  will not allow her to move around.. Had to be non mobile when pregnant..  so she can't do it. Thought about bedrest to save her baby.   Will make a list of questions, and look up places tonight, tomorrow make the phone calls    Clinical Assessment/Diagnosis  No diagnosis found.    Assessment: Patient currently experiencing ***.   Patient may benefit from continued support of integrated behavioral health services.  Plan: Follow up with behavioral health clinician on : *** Behavioral recommendations: *** Referral(s): Integrated [employer] (In Clinic)  I discussed the assessment and treatment plan with the patient and/or parent/guardian. They were provided an opportunity to ask questions and all were answered. They agreed with the plan and demonstrated an understanding of the instructions.   They were advised to call back or seek an in-person evaluation if the symptoms worsen or if the condition fails to improve as anticipated.  Leanard Dimaio LITTIE Seats, LCSWA

## 2023-12-23 ENCOUNTER — Ambulatory Visit

## 2023-12-25 ENCOUNTER — Ambulatory Visit: Payer: Self-pay | Admitting: Licensed Clinical Social Worker

## 2023-12-25 ENCOUNTER — Ambulatory Visit: Admitting: Obstetrics and Gynecology

## 2023-12-25 ENCOUNTER — Encounter: Payer: Self-pay | Admitting: Obstetrics and Gynecology

## 2023-12-25 VITALS — BP 128/83 | HR 139 | Temp 98.3°F | Wt 234.0 lb

## 2023-12-25 DIAGNOSIS — O24419 Gestational diabetes mellitus in pregnancy, unspecified control: Secondary | ICD-10-CM | POA: Diagnosis not present

## 2023-12-25 DIAGNOSIS — O99343 Other mental disorders complicating pregnancy, third trimester: Secondary | ICD-10-CM

## 2023-12-25 DIAGNOSIS — O99210 Obesity complicating pregnancy, unspecified trimester: Secondary | ICD-10-CM

## 2023-12-25 DIAGNOSIS — O10919 Unspecified pre-existing hypertension complicating pregnancy, unspecified trimester: Secondary | ICD-10-CM

## 2023-12-25 DIAGNOSIS — O0990 Supervision of high risk pregnancy, unspecified, unspecified trimester: Secondary | ICD-10-CM | POA: Diagnosis not present

## 2023-12-25 DIAGNOSIS — O09522 Supervision of elderly multigravida, second trimester: Secondary | ICD-10-CM

## 2023-12-25 DIAGNOSIS — F32A Depression, unspecified: Secondary | ICD-10-CM | POA: Diagnosis not present

## 2023-12-25 NOTE — Progress Notes (Signed)
 PRENATAL VISIT NOTE  Subjective:  Rita Joseph is a 43 y.o. [phone] at [redacted]w[redacted]d being seen today for ongoing prenatal care.  She is currently monitored for the following issues for this high-risk pregnancy and has Vitamin D  deficiency; History of preterm delivery, currently pregnant; Incompetence of cervix; Obesity (BMI 30-39.9); Supervision of high risk pregnancy, antepartum; AMA (advanced maternal age) multigravida 35+, second trimester; Maternal obesity affecting pregnancy, antepartum; Chronic hypertension affecting pregnancy; History of gestational diabetes in prior pregnancy, currently pregnant; History of postpartum hemorrhage, currently pregnant in second trimester; Gestational diabetes mellitus (GDM) affecting pregnancy, antepartum; and Cystic fibrosis carrier on their problem list.  Patient reports occasional contractions.  Contractions: Irritability. Vag. Bleeding: None.  Movement: Present. Denies leaking of fluid.   The following portions of the patient's history were reviewed and updated as appropriate: allergies, current medications, past family history, past medical history, past social history, past surgical history and problem list.   Objective:   Vitals:   12/25/23 1025  BP: 128/83  Pulse: (!) 139  Temp: 98.3 F (36.8 C)  Weight: 234 lb (106.1 kg)    Fetal Status:  Fetal Heart Rate (bpm): 159 Fundal Height: 36 cm Movement: Present    General: Alert, oriented and cooperative. Patient is in no acute distress.  Skin: Skin is warm and dry. No rash noted.   Cardiovascular: Normal heart rate noted  Respiratory: Normal respiratory effort, no problems with respiration noted  Abdomen: Soft, gravid, appropriate for gestational age.  Pain/Pressure: Present     Pelvic: Cervical exam deferred        Extremities: Normal range of motion.  Edema: Trace  Mental Status: Normal mood and affect. Normal behavior. Normal judgment and thought content.      11/11/2023    8:32 AM  10/14/2023    3:28 PM 07/11/2023   10:54 AM  Depression screen PHQ 2/9  Decreased Interest 3 1 1   Down, Depressed, Hopeless 3 0 3  PHQ - 2 Score 6 1 4   Altered sleeping 3 0 3  Tired, decreased energy 3 2 2   Change in appetite 3 0 2  Feeling bad or failure about yourself  0 0 0  Trouble concentrating 0 0 0  Moving slowly or fidgety/restless 0 0 0  Suicidal thoughts 0 0 0  PHQ-9 Score 15  3  11       Data saved with a previous flowsheet row definition        11/11/2023    8:34 AM 10/14/2023    3:30 PM 07/11/2023   11:00 AM 09/06/2021   11:38 AM  GAD 7 : Generalized Anxiety Score  Nervous, Anxious, on Edge 0 0 2 2  Control/stop worrying 3 0 3 2  Worry too much - different things 0 0 3 2  Trouble relaxing 3 1 1 1   Restless 3 0 1 0  Easily annoyed or irritable 3 0 1 2  Afraid - awful might happen 1 0 0 0  Total GAD 7 Score 13 1 11 9   Anxiety Difficulty    Somewhat difficult    Assessment and Plan:  Pregnancy: [id number] at [redacted]w[redacted]d 1. Supervision of high risk pregnancy, antepartum (Primary) Patient is doing well  Cultures next visit in anticipation for IOL at 36 weeks per MFM Long discussion with patient and her obstetrical traumas and the feeling of not being heard. Patient advised to advocate for herself in labor, if needed This is not a planned pregnancy and patient does not want  to keep the newborn but will as her partner desires to have the baby. Patient is at high risk for pp depression Plan for sterilization prior to hospital discharge  2. Chronic hypertension affecting pregnancy Stable Patient inconsistent with ASA  3. Gestational diabetes mellitus (GDM) affecting pregnancy, antepartum Dexcom removed but patient reports pp 2's. She admits that she is not always fasting and am reading range 80-100 Continue metformin  BID Continue antenatal testing  4. Obesity affecting pregnancy, antepartum, unspecified obesity type   5. AMA (advanced maternal age) multigravida 35+,  second trimester   Preterm labor symptoms and general obstetric precautions including but not limited to vaginal bleeding, contractions, leaking of fluid and fetal movement were reviewed in detail with the patient. Please refer to After Visit Summary for other counseling recommendations.   Return in about 1 week (around 01/01/2024) for in person, ROB, High risk.  Future Appointments  Date Time Provider Department Center  12/30/2023 10:45 AM [facility]  01/01/2024  8:55 AM Wrangler Penning, Winton, MD [facility] None  01/01/2024 10:45 AM Sherrine Josephs L, LCSWA [facility] None  01/07/2024 11:15 AM [facility] PROVIDER 1 [facility]  01/07/2024 11:30 AM [facility] US4 [facility]    Winton Felt, MD

## 2023-12-25 NOTE — BH Specialist Note (Signed)
 Integrated Behavioral Health via Telemedicine Visit  12/30/2023 Rita Joseph [id number]  Number of Integrated Behavioral Health Clinician visits: 2- Second Visit  Session Start time: 0845   Session End time: 1000  Total time in minutes: 75    Referring Provider: Dr. Zina, MD Patient/Family location: Home [facility] Provider location: Remote Office All persons participating in visit: Patient and [facility] Types of Service: Individual psychotherapy and Video visit  I connected with Rita Joseph and/or Rita List patient via  Telephone or Engineer, Civil (consulting)  (Video is Caregility application) and verified that I am speaking with the correct person using two identifiers. Discussed confidentiality: Yes   I discussed the limitations of telemedicine and the availability of in person appointments.  Discussed there is a possibility of technology failure and discussed alternative modes of communication if that failure occurs.  I discussed that engaging in this telemedicine visit, they consent to the provision of behavioral healthcare and the services will be billed under their insurance.  Patient and/or legal guardian expressed understanding and consented to Telemedicine visit: Yes   Presenting Concerns: Patient and/or family reports the following symptoms/concerns: ongoing perinatal depression.  Duration of problem: Months; Severity of problem: moderate  Patient and/or Family's Strengths/Protective Factors: Social and Emotional competence, Concrete supports in place (healthy food, safe environments, etc.), and Caregiver has knowledge of parenting & child development  Goals Addressed: Patient will:  Reduce symptoms of: anxiety and depression   Increase knowledge and/or ability of: coping skills and healthy habits   Demonstrate ability to: Increase healthy adjustment to current life circumstances and Increase adequate support systems for patient/family  Progress  towards Goals: Ongoing    Interventions: Interventions utilized:  Motivational Interviewing, Mindfulness or Management Consultant, Supportive Counseling, Psychoeducation and/or Health Education, Communication Skills, and Supportive Reflection Standardized Assessments completed: Not Needed    Patient and/or Family Response:Patient was present for today's virtual session and processed her thoughts and feelings about the initial appointment, stating that the first session felt productive and supportive. She explored both high and low moments from the week, noting that attending her family's Thanksgiving gathering will be emotionally significant, as this will be the first time her family sees her pregnant; she reports her sister plans to prepare the family to limit overwhelming questions. Patient also discussed ongoing difficulty bonding with the baby and ambivalence regarding the possibility of adoption, noting she may be more inclined to keep the baby if her husband does not support adoption. She identified fears related to managing the care of a toddler and newborn simultaneously and her concern about not receiving adequate support. Patient agreed to initiate a conversation with her support system about her postpartum needs and to create a visual to-do list to help family members share daily responsibilities.   Clinical Assessment/Diagnosis  Perinatal depression in third trimester    Assessment: Patient currently experiencing mixed emotions about her pregnancy, including anxiety about family reactions and ongoing difficulty feeling connected to her baby, as well as ambivalence regarding possible adoption. She is also feeling overwhelmed by the thought of caring for both a toddler and a newborn with limited support..   Patient may benefit from continued support of integrated behavioral health services.  Plan: Follow up with behavioral health clinician on : 01/01/2024 Behavioral recommendations:  patient to continue exploring her feelings around pregnancy and potential adoption while identifying her support needs for the postpartum period. Encourage open communication with her family and partner regarding shared responsibilities and the use of structured tools (e.g.,  visual to-do lists) to reduce stress and enhance support. Referral(s): Integrated [employer] (In Clinic)  I discussed the assessment and treatment plan with the patient and/or parent/guardian. They were provided an opportunity to ask questions and all were answered. They agreed with the plan and demonstrated an understanding of the instructions.   They were advised to call back or seek an in-person evaluation if the symptoms worsen or if the condition fails to improve as anticipated.  [name] LITTIE Seats, LCSWA

## 2023-12-30 ENCOUNTER — Inpatient Hospital Stay (HOSPITAL_COMMUNITY): Admitting: Anesthesiology

## 2023-12-30 ENCOUNTER — Inpatient Hospital Stay (HOSPITAL_COMMUNITY)
Admission: AD | Admit: 2023-12-30 | Discharge: 2024-01-03 | DRG: 806 | Disposition: A | Attending: Obstetrics and Gynecology | Admitting: Obstetrics and Gynecology

## 2023-12-30 ENCOUNTER — Ambulatory Visit: Attending: Obstetrics and Gynecology | Admitting: *Deleted

## 2023-12-30 ENCOUNTER — Encounter (HOSPITAL_COMMUNITY): Payer: Self-pay | Admitting: Obstetrics and Gynecology

## 2023-12-30 ENCOUNTER — Inpatient Hospital Stay (HOSPITAL_BASED_OUTPATIENT_CLINIC_OR_DEPARTMENT_OTHER): Admitting: Obstetrics

## 2023-12-30 ENCOUNTER — Other Ambulatory Visit: Payer: Self-pay

## 2023-12-30 VITALS — BP 126/84 | HR 118

## 2023-12-30 DIAGNOSIS — Z833 Family history of diabetes mellitus: Secondary | ICD-10-CM

## 2023-12-30 DIAGNOSIS — O09523 Supervision of elderly multigravida, third trimester: Secondary | ICD-10-CM | POA: Diagnosis not present

## 2023-12-30 DIAGNOSIS — Z3A35 35 weeks gestation of pregnancy: Secondary | ICD-10-CM | POA: Insufficient documentation

## 2023-12-30 DIAGNOSIS — O99213 Obesity complicating pregnancy, third trimester: Secondary | ICD-10-CM | POA: Diagnosis not present

## 2023-12-30 DIAGNOSIS — I10 Essential (primary) hypertension: Secondary | ICD-10-CM

## 2023-12-30 DIAGNOSIS — O3663X0 Maternal care for excessive fetal growth, third trimester, not applicable or unspecified: Secondary | ICD-10-CM | POA: Diagnosis not present

## 2023-12-30 DIAGNOSIS — O24415 Gestational diabetes mellitus in pregnancy, controlled by oral hypoglycemic drugs: Secondary | ICD-10-CM

## 2023-12-30 DIAGNOSIS — Z79899 Other long term (current) drug therapy: Secondary | ICD-10-CM

## 2023-12-30 DIAGNOSIS — O10913 Unspecified pre-existing hypertension complicating pregnancy, third trimester: Secondary | ICD-10-CM

## 2023-12-30 DIAGNOSIS — O10919 Unspecified pre-existing hypertension complicating pregnancy, unspecified trimester: Secondary | ICD-10-CM

## 2023-12-30 DIAGNOSIS — O1414 Severe pre-eclampsia complicating childbirth: Secondary | ICD-10-CM | POA: Diagnosis not present

## 2023-12-30 DIAGNOSIS — Z30017 Encounter for initial prescription of implantable subdermal contraceptive: Secondary | ICD-10-CM

## 2023-12-30 DIAGNOSIS — O99214 Obesity complicating childbirth: Secondary | ICD-10-CM | POA: Diagnosis present

## 2023-12-30 DIAGNOSIS — D62 Acute posthemorrhagic anemia: Secondary | ICD-10-CM | POA: Diagnosis not present

## 2023-12-30 DIAGNOSIS — O24419 Gestational diabetes mellitus in pregnancy, unspecified control: Secondary | ICD-10-CM | POA: Diagnosis present

## 2023-12-30 DIAGNOSIS — Z141 Cystic fibrosis carrier: Secondary | ICD-10-CM

## 2023-12-30 DIAGNOSIS — O9081 Anemia of the puerperium: Secondary | ICD-10-CM | POA: Diagnosis not present

## 2023-12-30 DIAGNOSIS — O09299 Supervision of pregnancy with other poor reproductive or obstetric history, unspecified trimester: Secondary | ICD-10-CM

## 2023-12-30 DIAGNOSIS — O09292 Supervision of pregnancy with other poor reproductive or obstetric history, second trimester: Secondary | ICD-10-CM

## 2023-12-30 DIAGNOSIS — Z8249 Family history of ischemic heart disease and other diseases of the circulatory system: Secondary | ICD-10-CM

## 2023-12-30 DIAGNOSIS — O24425 Gestational diabetes mellitus in childbirth, controlled by oral hypoglycemic drugs: Secondary | ICD-10-CM | POA: Diagnosis not present

## 2023-12-30 DIAGNOSIS — O114 Pre-existing hypertension with pre-eclampsia, complicating childbirth: Secondary | ICD-10-CM | POA: Diagnosis not present

## 2023-12-30 DIAGNOSIS — O1092 Unspecified pre-existing hypertension complicating childbirth: Secondary | ICD-10-CM | POA: Diagnosis present

## 2023-12-30 DIAGNOSIS — O164 Unspecified maternal hypertension, complicating childbirth: Secondary | ICD-10-CM | POA: Diagnosis not present

## 2023-12-30 DIAGNOSIS — O0990 Supervision of high risk pregnancy, unspecified, unspecified trimester: Principal | ICD-10-CM

## 2023-12-30 DIAGNOSIS — O99210 Obesity complicating pregnancy, unspecified trimester: Secondary | ICD-10-CM

## 2023-12-30 DIAGNOSIS — O09522 Supervision of elderly multigravida, second trimester: Secondary | ICD-10-CM | POA: Diagnosis present

## 2023-12-30 DIAGNOSIS — O1413 Severe pre-eclampsia, third trimester: Secondary | ICD-10-CM | POA: Diagnosis present

## 2023-12-30 HISTORY — DX: Gestational diabetes mellitus in pregnancy, unspecified control: O24.419

## 2023-12-30 HISTORY — DX: Type 2 diabetes mellitus without complications: E11.9

## 2023-12-30 HISTORY — DX: Depression, unspecified: F32.A

## 2023-12-30 HISTORY — DX: Other complications of anesthesia, initial encounter: T88.59XA

## 2023-12-30 HISTORY — DX: Burn of unspecified body region, unspecified degree: T30.0

## 2023-12-30 LAB — CBC
HCT: 37.7 % (ref 36.0–46.0)
HCT: 36.1 % (ref 36.0–46.0)
Hemoglobin: 13.2 g/dL (ref 12.0–15.0)
Hemoglobin: 12.4 g/dL (ref 12.0–15.0)
MCH: 33.1 pg (ref 26.0–34.0)
MCH: 32.3 pg (ref 26.0–34.0)
MCHC: 35 g/dL (ref 30.0–36.0)
MCHC: 34.3 g/dL (ref 30.0–36.0)
MCV: 94.5 fL (ref 80.0–100.0)
MCV: 94 fL (ref 80.0–100.0)
Platelets: 213 K/uL (ref 150–400)
Platelets: 193 K/uL (ref 150–400)
RBC: 3.99 MIL/uL (ref 3.87–5.11)
RBC: 3.84 MIL/uL — ABNORMAL LOW (ref 3.87–5.11)
RDW: 14.5 % (ref 11.5–15.5)
RDW: 14.3 % (ref 11.5–15.5)
WBC: 10.6 K/uL — ABNORMAL HIGH (ref 4.0–10.5)
WBC: 9.9 K/uL (ref 4.0–10.5)
nRBC: 0 % (ref 0.0–0.2)
nRBC: 0 % (ref 0.0–0.2)

## 2023-12-30 LAB — TYPE AND SCREEN
ABO/RH(D): AB POS
Antibody Screen: NEGATIVE

## 2023-12-30 LAB — COMPREHENSIVE METABOLIC PANEL WITH GFR
ALT: 40 U/L (ref 0–44)
ALT: 36 U/L (ref 0–44)
AST: 46 U/L — ABNORMAL HIGH (ref 15–41)
AST: 36 U/L (ref 15–41)
Albumin: 2.7 g/dL — ABNORMAL LOW (ref 3.5–5.0)
Albumin: 2.5 g/dL — ABNORMAL LOW (ref 3.5–5.0)
Alkaline Phosphatase: 89 U/L (ref 38–126)
Alkaline Phosphatase: 79 U/L (ref 38–126)
Anion gap: 13 (ref 5–15)
Anion gap: 8 (ref 5–15)
BUN: 9 mg/dL (ref 6–20)
BUN: 8 mg/dL (ref 6–20)
CO2: 17 mmol/L — ABNORMAL LOW (ref 22–32)
CO2: 19 mmol/L — ABNORMAL LOW (ref 22–32)
Calcium: 8.6 mg/dL — ABNORMAL LOW (ref 8.9–10.3)
Calcium: 8.3 mg/dL — ABNORMAL LOW (ref 8.9–10.3)
Chloride: 104 mmol/L (ref 98–111)
Chloride: 104 mmol/L (ref 98–111)
Creatinine, Ser: 0.77 mg/dL (ref 0.44–1.00)
Creatinine, Ser: 0.62 mg/dL (ref 0.44–1.00)
GFR, Estimated: >60 mL/min (ref >60)
GFR, Estimated: >60 mL/min (ref >60)
Glucose, Bld: 113 mg/dL — ABNORMAL HIGH (ref 70–99)
Glucose, Bld: 156 mg/dL — ABNORMAL HIGH (ref 70–99)
Potassium: 5.1 mmol/L (ref 3.5–5.1)
Potassium: 3.8 mmol/L (ref 3.5–5.1)
Sodium: 134 mmol/L — ABNORMAL LOW (ref 135–145)
Sodium: 131 mmol/L — ABNORMAL LOW (ref 135–145)
Total Bilirubin: 0.7 mg/dL (ref 0.0–1.2)
Total Bilirubin: 0.6 mg/dL (ref 0.0–1.2)
Total Protein: 6.2 g/dL — ABNORMAL LOW (ref 6.5–8.1)
Total Protein: 5.7 g/dL — ABNORMAL LOW (ref 6.5–8.1)

## 2023-12-30 LAB — GLUCOSE, CAPILLARY
Glucose-Capillary: 105 mg/dL — ABNORMAL HIGH (ref 70–99)
Glucose-Capillary: 99 mg/dL (ref 70–99)
Glucose-Capillary: 146 mg/dL — ABNORMAL HIGH (ref 70–99)
Glucose-Capillary: 160 mg/dL — ABNORMAL HIGH (ref 70–99)

## 2023-12-30 LAB — GROUP B STREP BY PCR: Group B strep by PCR: NEGATIVE

## 2023-12-30 LAB — PROTEIN / CREATININE RATIO, URINE
Creatinine, Urine: 246 mg/dL
Protein Creatinine Ratio: 0.11 mg/mg{creat} (ref 0.00–0.15)
Total Protein, Urine: 27 mg/dL

## 2023-12-30 MED ORDER — OXYCODONE-ACETAMINOPHEN 5-325 MG PO TABS: 2.0000 | ORAL_TABLET | ORAL | Status: DC | PRN

## 2023-12-30 MED ORDER — LABETALOL HCL 5 MG/ML IV SOLN: 40.0000 mg | INTRAVENOUS | Status: DC | PRN

## 2023-12-30 MED ORDER — MAGNESIUM SULFATE BOLUS VIA INFUSION
4.0000 g | Freq: Once | INTRAVENOUS | Status: AC
  Administered 2023-12-30: 4 g via INTRAVENOUS
  Filled 2023-12-30: qty 1000

## 2023-12-30 MED ORDER — OXYTOCIN BOLUS FROM INFUSION
333.0000 mL | Freq: Once | INTRAVENOUS | Status: AC
  Administered 2024-01-01: 333 mL via INTRAVENOUS

## 2023-12-30 MED ORDER — SODIUM CHLORIDE 0.9 % IV SOLN: 250.0000 mL | INTRAVENOUS | Status: AC | PRN

## 2023-12-30 MED ORDER — HYDRALAZINE HCL 20 MG/ML IJ SOLN: 5.0000 mg | INTRAMUSCULAR | Status: DC | PRN

## 2023-12-30 MED ORDER — PHENYLEPHRINE 80 MCG/ML (10ML) SYRINGE FOR IV PUSH (FOR BLOOD PRESSURE SUPPORT)
80.0000 ug | PREFILLED_SYRINGE | INTRAVENOUS | Status: DC | PRN
  Administered 2023-12-30: 80 ug via INTRAVENOUS
  Filled 2023-12-30: qty 10

## 2023-12-30 MED ORDER — LACTATED RINGERS IV BOLUS
1000.0000 mL | Freq: Once | INTRAVENOUS | Status: AC
  Administered 2023-12-30: 1000 mL via INTRAVENOUS

## 2023-12-30 MED ORDER — HYDROXYZINE HCL 50 MG PO TABS: 50.0000 mg | ORAL_TABLET | Freq: Four times a day (QID) | ORAL | Status: DC | PRN

## 2023-12-30 MED ORDER — METFORMIN HCL ER 500 MG PO TB24: 500.0000 mg | ORAL_TABLET | Freq: Two times a day (BID) | ORAL | Status: DC

## 2023-12-30 MED ORDER — DIPHENHYDRAMINE HCL 50 MG/ML IJ SOLN: 12.5000 mg | INTRAMUSCULAR | Status: DC | PRN

## 2023-12-30 MED ORDER — LIDOCAINE-EPINEPHRINE (PF) 2 %-1:200000 IJ SOLN
INTRAMUSCULAR | Status: DC | PRN
  Administered 2023-12-30: 3 mL via EPIDURAL

## 2023-12-30 MED ORDER — ACETAMINOPHEN 325 MG PO TABS
650.0000 mg | ORAL_TABLET | ORAL | Status: DC | PRN
  Administered 2023-12-31: 650 mg via ORAL
  Filled 2023-12-30: qty 2

## 2023-12-30 MED ORDER — OXYTOCIN-SODIUM CHLORIDE 30-0.9 UT/500ML-% IV SOLN
2.5000 [IU]/h | INTRAVENOUS | Status: DC
  Filled 2023-12-30: qty 500

## 2023-12-30 MED ORDER — PHENYLEPHRINE 80 MCG/ML (10ML) SYRINGE FOR IV PUSH (FOR BLOOD PRESSURE SUPPORT)
80.0000 ug | PREFILLED_SYRINGE | INTRAVENOUS | Status: DC | PRN
  Administered 2023-12-30: 160 ug via INTRAVENOUS

## 2023-12-30 MED ORDER — SOD CITRATE-CITRIC ACID 500-334 MG/5ML PO SOLN: 30.0000 mL | ORAL | Status: DC | PRN

## 2023-12-30 MED ORDER — MAGNESIUM SULFATE 40 GM/1000ML IV SOLN
2.0000 g/h | INTRAVENOUS | Status: AC
  Administered 2023-12-30 – 2024-01-01 (×3): 2 g/h via INTRAVENOUS
  Filled 2023-12-30 (×3): qty 1000

## 2023-12-30 MED ORDER — LACTATED RINGERS IV SOLN: 500.0000 mL | INTRAVENOUS | Status: DC | PRN

## 2023-12-30 MED ORDER — PENICILLIN G POT IN DEXTROSE 60000 UNIT/ML IV SOLN
3.0000 10*6.[IU] | INTRAVENOUS | Status: DC
  Administered 2023-12-31 – 2024-01-01 (×7): 3 10*6.[IU] via INTRAVENOUS
  Filled 2023-12-30 (×9): qty 50

## 2023-12-30 MED ORDER — ZOLPIDEM TARTRATE 5 MG PO TABS: 5.0000 mg | ORAL_TABLET | Freq: Every evening | ORAL | Status: DC | PRN

## 2023-12-30 MED ORDER — FENTANYL CITRATE (PF) 100 MCG/2ML IJ SOLN: 50.0000 ug | INTRAMUSCULAR | Status: DC | PRN

## 2023-12-30 MED ORDER — EPHEDRINE 5 MG/ML INJ: 10.0000 mg | INTRAVENOUS | Status: DC | PRN

## 2023-12-30 MED ORDER — METFORMIN HCL 500 MG PO TABS
500.0000 mg | ORAL_TABLET | Freq: Two times a day (BID) | ORAL | Status: DC
  Administered 2023-12-30 – 2023-12-31 (×3): 500 mg via ORAL
  Filled 2023-12-30 (×4): qty 1

## 2023-12-30 MED ORDER — ONDANSETRON HCL 4 MG/2ML IJ SOLN
4.0000 mg | Freq: Four times a day (QID) | INTRAMUSCULAR | Status: DC | PRN
  Administered 2023-12-31: 4 mg via INTRAVENOUS
  Filled 2023-12-30: qty 2

## 2023-12-30 MED ORDER — TERBUTALINE SULFATE 1 MG/ML IJ SOLN: 0.2500 mg | Freq: Once | INTRAMUSCULAR | Status: DC | PRN

## 2023-12-30 MED ORDER — LACTATED RINGERS IV SOLN: INTRAVENOUS | Status: AC

## 2023-12-30 MED ORDER — SODIUM CHLORIDE 0.9 % IV SOLN
5.0000 10*6.[IU] | Freq: Once | INTRAVENOUS | Status: AC
  Administered 2023-12-30: 5 10*6.[IU] via INTRAVENOUS
  Filled 2023-12-30: qty 5

## 2023-12-30 MED ORDER — SODIUM CHLORIDE 0.9% FLUSH: 3.0000 mL | INTRAVENOUS | Status: DC | PRN

## 2023-12-30 MED ORDER — SODIUM CHLORIDE 0.9% FLUSH: 3.0000 mL | Freq: Two times a day (BID) | INTRAVENOUS | Status: DC

## 2023-12-30 MED ORDER — OXYCODONE-ACETAMINOPHEN 5-325 MG PO TABS: 1.0000 | ORAL_TABLET | ORAL | Status: DC | PRN

## 2023-12-30 MED ORDER — LIDOCAINE HCL (PF) 1 % IJ SOLN: 30.0000 mL | INTRAMUSCULAR | Status: DC | PRN

## 2023-12-30 MED ORDER — OXYTOCIN-SODIUM CHLORIDE 30-0.9 UT/500ML-% IV SOLN
1.0000 m[IU]/min | INTRAVENOUS | Status: DC
  Administered 2023-12-30: 2 m[IU]/min via INTRAVENOUS
  Administered 2024-01-01: 26 m[IU]/min via INTRAVENOUS
  Filled 2023-12-30: qty 500

## 2023-12-30 MED ORDER — LACTATED RINGERS IV SOLN
500.0000 mL | Freq: Once | INTRAVENOUS | Status: AC
  Administered 2023-12-30: 500 mL via INTRAVENOUS

## 2023-12-30 MED ORDER — LACTATED RINGERS IV SOLN: INTRAVENOUS | Status: DC

## 2023-12-30 MED ORDER — SODIUM CHLORIDE 0.9 % IV SOLN
INTRAVENOUS | Status: DC | PRN
  Administered 2023-12-30: 5 mL via EPIDURAL

## 2023-12-30 MED ORDER — AMLODIPINE BESYLATE 5 MG PO TABS
5.0000 mg | ORAL_TABLET | Freq: Every day | ORAL | Status: DC
  Administered 2023-12-31 – 2024-01-03 (×4): 5 mg via ORAL
  Filled 2023-12-30 (×5): qty 1

## 2023-12-30 MED ORDER — FENTANYL-BUPIVACAINE-NACL 0.5-0.125-0.9 MG/250ML-% EP SOLN
12.0000 mL/h | EPIDURAL | Status: DC | PRN
  Administered 2023-12-30 – 2023-12-31 (×2): 12 mL/h via EPIDURAL
  Filled 2023-12-30 (×2): qty 250

## 2023-12-30 MED ORDER — HYDRALAZINE HCL 20 MG/ML IJ SOLN: 10.0000 mg | INTRAMUSCULAR | Status: DC | PRN

## 2023-12-30 MED ORDER — LABETALOL HCL 5 MG/ML IV SOLN
20.0000 mg | INTRAVENOUS | Status: DC | PRN
  Administered 2023-12-30: 20 mg via INTRAVENOUS
  Filled 2023-12-30: qty 4

## 2023-12-30 MED ORDER — LIDOCAINE HCL (PF) 1 % IJ SOLN
INTRAMUSCULAR | Status: DC | PRN
  Administered 2023-12-30: 3 mL via SUBCUTANEOUS
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
## 2023-12-30 NOTE — MAU Note (Signed)
.  Rita Joseph is a 43 y.o. at [redacted]w[redacted]d here in MAU reporting: Sent from MFM for fetal tachycardia. Denies VB, LOF, and CTX. Reports +FM. C/o L leg pain that pt describes as sciatic pain and states this leg is giving her trouble today.  LMP: na Onset of complaint: today Pain score: 5/10 There were no vitals filed for this visit.   FHT: 169  Lab orders placed from triage: na

## 2023-12-30 NOTE — Progress Notes (Cosign Needed Addendum)
 Labor Progress Note Charlane Westry is a 43 y.o. [phone] at [redacted]w[redacted]d presented for IOL due to fetal tachycardia and cHTN with superimposed pre-eclampsia with severe features.   S: Epiduralized and more comfortable.  Sleeping.    O:  BP 110/86   Pulse 94   Temp 98.2 F (36.8 C) (Oral)   Resp 16   Ht 5' 4 (1.626 m)   Wt 104.3 kg   LMP 04/27/2023 (Exact Date)   SpO2 96%   BMI 39.48 kg/m  EFM: 150/mod/no accels, no decels  CVE: Dilation: 5 Effacement (%): 60 Station: Ballotable Presentation: Vertex Exam by:: Dr. Jomarie   A&P: 43 y.o. [id number] [redacted]w[redacted]d IOL preE w/ SF #Labor: Progressing well. Making modest cervical change but head is still very ballotable so will defer AROM until head is more engaged.  Continue pitocin for now.  Repeat check in 4 hours or sooner if indicated. #Pain: epiduralized, well controlled #FWB: cat 1 tracing #GBS not done - empiric tx ordered #PreE SF - continue mag, labetalol protocol  Paralee JONELLE Carpen, MD 11:37 PM  Attestation of Supervision of Student:  I confirm that I have verified the information documented in the resident's note and that I have also personally directly supervised the history, physical exam and all medical decision making activities.  I have verified that all services and findings are accurately documented in this student's note; and I agree with management and plan as outlined in the documentation. I have also made any necessary editorial changes.   Barkley LITTIE Angles, MD OB Fellow 12/31/2023 1:44 AM

## 2023-12-30 NOTE — Anesthesia Procedure Notes (Signed)
 Epidural Patient location during procedure: OB Start time: 12/30/2023 8:40 PM End time: 12/30/2023 8:46 PM  Staffing Anesthesiologist: Boone Fess, MD Performed: anesthesiologist   Preanesthetic Checklist Completed: patient identified, IV checked, site marked, risks and benefits discussed, surgical consent, monitors and equipment checked, pre-op evaluation and timeout performed  Epidural Patient position: sitting Prep: ChloraPrep Patient monitoring: heart rate, continuous pulse ox and blood pressure Approach: midline Location: L4-L5 Injection technique: LOR saline  Needle:  Needle type: Tuohy  Needle gauge: 17 G Needle length: 9 cm Needle insertion depth: 7 cm Catheter type: closed end flexible Catheter size: 19 Gauge Catheter at skin depth: 12 cm Test dose: negative and 1.5% lidocaine with Epi 1:200 K  Assessment Sensory level: T10 Events: blood not aspirated, no cerebrospinal fluid, injection not painful, no injection resistance, no paresthesia and negative IV test  Additional Notes First/one attempt Pt. Evaluated and documentation done after procedure finished. Patient identified. Risks/Benefits/Options discussed with patient including but not limited to bleeding, infection, nerve damage, paralysis, failed block, incomplete pain control, headache, blood pressure changes, nausea, vomiting, reactions to medication both or allergic, itching and postpartum back pain. Confirmed with bedside nurse the patient's most recent platelet count. Confirmed with patient that they are not currently taking any anticoagulation, have any bleeding history or any family history of bleeding disorders. Patient expressed understanding and wished to proceed. All questions were answered. Sterile technique was used throughout the entire procedure. Please see nursing notes for vital signs. Test dose was given through epidural catheter and negative prior to continuing to dose epidural or start infusion.  Warning signs of high block given to the patient including shortness of breath, tingling/numbness in hands, complete motor block, or any concerning symptoms with instructions to call for help. Patient was given instructions on fall risk and not to get out of bed. All questions and concerns addressed with instructions to call with any issues or inadequate analgesia.     Patient tolerated the insertion well without immediate complications.  Reason for block: procedure for painReason for block:procedure for pain
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
## 2023-12-30 NOTE — Progress Notes (Signed)
 MFM Consult Note  Rita Joseph is currently at 35 weeks and 2 days.  She has been followed due to advanced maternal age (43 years old), chronic hypertension treated with amlodipine , and gestational diabetes treated with metformin .  She reports that her fingerstick values remain elevated despite treatment with metformin .  The overall EFW obtained about 4 weeks ago was 5 pounds 10 ounces (greater than 99th percentile).  The patient's NST today was reactive overall.  However, fetal tachycardia with a baseline in the 170s range was noted for over an hour and a half that she was on the monitor in our office.  The patient also reports that she has not been feeling right.  She reports sciatic pain and pain in the vaginal area.  Due to fetal tachycardia, the patient was sent to the MAU for prolonged monitoring.  In the MAU, fetal tachycardia continued to be noted for multiple hours.  Accelerations were noted on the fetal heart rate monitor.  Her PIH labs in the MAU today were all within normal limits, except for an AST of 46.  Her P/C ratio was 0.11.  Due to persistent fetal tachycardia and uncontrolled gestational diabetes with a macrosomic fetus, delivery is recommended today in order to avoid an adverse pregnancy outcome such as an IUFD.    The patient is happy and comfortable with proceeding with delivery today.  A total of 20 minutes was spent counseling and coordinating the care for this patient.
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
## 2023-12-30 NOTE — Procedures (Signed)
 Rita Joseph 1980-07-20 [redacted]w[redacted]d  Fetus A Non-Stress Test Interpretation for 12/30/23  Indication: Advanced Maternal Age >40 years  Fetal Heart Rate A Mode: External Baseline Rate (A): 170 bpm Variability: Moderate Accelerations: 15 x 15 Decelerations: None Multiple birth?: No  Uterine Activity Mode: Palpation, Toco Contraction Frequency (min): 1 uc Contraction Duration (sec): 60 Contraction Quality: Mild Resting Tone Palpated: Relaxed Resting Time: Adequate  Interpretation (Fetal Testing) Nonstress Test Interpretation: Reactive Comments: Dr. ILEANA reviewed tracing. Patient sent to MAU for proloned monitoring

## 2023-12-31 LAB — COMPREHENSIVE METABOLIC PANEL WITH GFR
ALT: 38 U/L (ref 0–44)
ALT: 44 U/L (ref 0–44)
AST: 48 U/L — ABNORMAL HIGH (ref 15–41)
AST: 46 U/L — ABNORMAL HIGH (ref 15–41)
Albumin: 2.3 g/dL — ABNORMAL LOW (ref 3.5–5.0)
Albumin: 2.5 g/dL — ABNORMAL LOW (ref 3.5–5.0)
Alkaline Phosphatase: 75 U/L (ref 38–126)
Alkaline Phosphatase: 81 U/L (ref 38–126)
Anion gap: 11 (ref 5–15)
Anion gap: 10 (ref 5–15)
BUN: 6 mg/dL (ref 6–20)
BUN: <5 mg/dL — ABNORMAL LOW (ref 6–20)
CO2: 18 mmol/L — ABNORMAL LOW (ref 22–32)
CO2: 19 mmol/L — ABNORMAL LOW (ref 22–32)
Calcium: 7.3 mg/dL — ABNORMAL LOW (ref 8.9–10.3)
Calcium: 6.8 mg/dL — ABNORMAL LOW (ref 8.9–10.3)
Chloride: 102 mmol/L (ref 98–111)
Chloride: 102 mmol/L (ref 98–111)
Creatinine, Ser: 0.81 mg/dL (ref 0.44–1.00)
Creatinine, Ser: 0.65 mg/dL (ref 0.44–1.00)
GFR, Estimated: >60 mL/min (ref >60)
GFR, Estimated: >60 mL/min (ref >60)
Glucose, Bld: 122 mg/dL — ABNORMAL HIGH (ref 70–99)
Glucose, Bld: 149 mg/dL — ABNORMAL HIGH (ref 70–99)
Potassium: 4.2 mmol/L (ref 3.5–5.1)
Potassium: 4 mmol/L (ref 3.5–5.1)
Sodium: 131 mmol/L — ABNORMAL LOW (ref 135–145)
Sodium: 131 mmol/L — ABNORMAL LOW (ref 135–145)
Total Bilirubin: 0.7 mg/dL (ref 0.0–1.2)
Total Bilirubin: 0.7 mg/dL (ref 0.0–1.2)
Total Protein: 5.3 g/dL — ABNORMAL LOW (ref 6.5–8.1)
Total Protein: 5.8 g/dL — ABNORMAL LOW (ref 6.5–8.1)

## 2023-12-31 LAB — GLUCOSE, CAPILLARY
Glucose-Capillary: 135 mg/dL — ABNORMAL HIGH (ref 70–99)
Glucose-Capillary: 85 mg/dL (ref 70–99)
Glucose-Capillary: 91 mg/dL (ref 70–99)
Glucose-Capillary: 95 mg/dL (ref 70–99)
Glucose-Capillary: 96 mg/dL (ref 70–99)

## 2023-12-31 LAB — CBC
HCT: 35.9 % — ABNORMAL LOW (ref 36.0–46.0)
HCT: 36.6 % (ref 36.0–46.0)
Hemoglobin: 11.8 g/dL — ABNORMAL LOW (ref 12.0–15.0)
Hemoglobin: 12.6 g/dL (ref 12.0–15.0)
MCH: 31 pg (ref 26.0–34.0)
MCH: 32.3 pg (ref 26.0–34.0)
MCHC: 32.9 g/dL (ref 30.0–36.0)
MCHC: 34.4 g/dL (ref 30.0–36.0)
MCV: 94.2 fL (ref 80.0–100.0)
MCV: 93.8 fL (ref 80.0–100.0)
Platelets: 181 K/uL (ref 150–400)
Platelets: 189 K/uL (ref 150–400)
RBC: 3.81 MIL/uL — ABNORMAL LOW (ref 3.87–5.11)
RBC: 3.9 MIL/uL (ref 3.87–5.11)
RDW: 14.5 % (ref 11.5–15.5)
RDW: 15 % (ref 11.5–15.5)
WBC: 10.9 K/uL — ABNORMAL HIGH (ref 4.0–10.5)
WBC: 9.7 K/uL (ref 4.0–10.5)
nRBC: 0 % (ref 0.0–0.2)
nRBC: 0 % (ref 0.0–0.2)

## 2023-12-31 LAB — SYPHILIS: RPR W/REFLEX TO RPR TITER AND TREPONEMAL ANTIBODIES, TRADITIONAL SCREENING AND DIAGNOSIS ALGORITHM: RPR Ser Ql: NONREACTIVE

## 2023-12-31 MED ORDER — INSULIN ASPART 100 UNIT/ML IJ SOLN
0.0000 [IU] | INTRAMUSCULAR | Status: DC
  Administered 2023-12-31: 2 [IU] via SUBCUTANEOUS
  Administered 2023-12-31: 3 [IU] via SUBCUTANEOUS
  Administered 2024-01-01: 6 [IU] via SUBCUTANEOUS
  Filled 2023-12-31: qty 6
  Filled 2023-12-31: qty 2
  Filled 2023-12-31: qty 3

## 2023-12-31 MED ORDER — CALCIUM CARBONATE ANTACID 500 MG PO CHEW
2.0000 | CHEWABLE_TABLET | Freq: Once | ORAL | Status: AC
  Administered 2023-12-31: 400 mg via ORAL
  Filled 2023-12-31: qty 2

## 2023-12-31 NOTE — Progress Notes (Signed)
 Labor Progress Note Rita Joseph is a 42 y.o. [phone] at [redacted]w[redacted]d presented for IOL for fetal tachycardia and cHTN with superimposed pre-eclampsia w/ severe features  S:  Comfortable with epidural, amenable to AROM with FSE  O:  BP 139/84   Pulse 90   Temp 98.2 F (36.8 C) (Axillary)   Resp 18   Ht 5' 4 (1.626 m)   Wt 104.3 kg   LMP 04/27/2023 (Exact Date)   SpO2 100%   BMI 39.48 kg/m  EFM: baseline 140 bpm/ moderate variability/ + accels/ - decels  Toco/IUPC: ctx q61min SVE: Dilation: 5.5 Effacement (%): 60 Station: -3 Presentation: Vertex Exam by:: Jomarie, MD Pitocin: 10 mu/min  A/P: 43 y.o. [id number] [redacted]w[redacted]d  1. Labor: AROMc with FSE placed, leak appreciated. Will continue with pitocin for augmentation 2. Pain: Epidural 3. GBS unknown, PCN ppx 4. PEC w/ SF: asymptomatic, continues on magnesium and Norvasc , having mild to non-sustained severe range pressures 5. GDMA2: On metformin  500mg  BID, also has POCT glucose & insulin ordered q4h while in latent labor   - Anticipate SVD.  Rita Joseph Jomarie, MD 4:17 PM

## 2023-12-31 NOTE — Progress Notes (Signed)
 Labor Progress Note  Cervical exam remains unchanged, on 25mu/min of pitocin. Fetal head still ballotable and high in the pelvis. Defer AROM at this time.   Barkley Angles, MD OB Fellow, Faculty Practice [facility], [facility] [employer]

## 2023-12-31 NOTE — Progress Notes (Signed)
 Patient ID: Rita Joseph, female   DOB: [birth year], 43 y.o.   MRN: [id number]   Subjective: -Care assumed of 43 y.o. [id number] at [redacted]w[redacted]d who presents for IOL s/t fetal tachycardia and CHTN with SI PreE. In room to meet acquaintance of patient and family. Patient resting in bed.  Reports slight HA on right side above eye that she rates a 2-3/10. She also reports nausea.   Objective: BP 122/73   Pulse 96   Temp 98 F (36.7 C) (Oral)   Resp 18   Ht 5' 4 (1.626 m)   Wt 104.3 kg   LMP 04/27/2023 (Exact Date)   SpO2 100%   BMI 39.48 kg/m  I/O last 3 completed shifts: In: 4968.6 [P.O.:1410; I.V.:2944.1; Other:114.5; IV Piggyback:500] Out: 3325 [Urine:3325] Total I/O In: 166.2 [I.V.:65.3; Other:100.9] Out: 1000 [Urine:1000]  Fetal Monitoring: FHT: 140 bpm, Mod Var, +Variable Decels, +10x10 Accels UC: Q2-78min, palpates moderate    Physical Exam: General appearance: Alert, Oriented, Mild Distress s/t nausea. Chest: normal rate and regular rhythm.  clear to auscultation, no wheezes, rales or rhonchi, symmetric air entry. Abdominal exam: Soft, Gravid, Appears LGA, Ctx palpate moderate. Extremities: No edema, +2 bicep reflexes Skin exam: Warm Dry  Vaginal Exam: SVE:   Dilation: 7 Effacement (%): 80 Station: -2 Exam by:: Rita Brought RN Membranes:AROM x 5 hrs Internal Monitors:FSE  Augmentation/Induction: Pitocin:22mUn/min Cytotec: S/P  Assessment:  IUP at 35.3 weeks Cat I FT Overall CHTN with SI PreE on Magnesium Infusion Nausea  Plan: -Patient offered and initially declines, but then accepts antiemetic.  Nurse notified.  -Continue to monitor HA and treat if worsening.  -Anticipate VD -Continue other mgmt as ordered.   Rita Joseph Rita Diniz,MSN, CNM 12/31/2023, 8:52 PM

## 2023-12-31 NOTE — Plan of Care (Signed)
  Problem: Education: Goal: Knowledge of General Education information will improve Description: Including pain rating scale, medication(s)/side effects and non-pharmacologic comfort measures Outcome: Progressing   Problem: Health Behavior/Discharge Planning: Goal: Ability to manage health-related needs will improve Outcome: Progressing   Problem: Clinical Measurements: Goal: Ability to maintain clinical measurements within normal limits will improve Outcome: Progressing Goal: Will remain free from infection Outcome: Progressing Goal: Diagnostic test results will improve Outcome: Progressing Goal: Respiratory complications will improve Outcome: Progressing Goal: Cardiovascular complication will be avoided Outcome: Progressing   Problem: Activity: Goal: Risk for activity intolerance will decrease Outcome: Progressing   Problem: Nutrition: Goal: Adequate nutrition will be maintained Outcome: Progressing   Problem: Coping: Goal: Level of anxiety will decrease Outcome: Progressing   Problem: Elimination: Goal: Will not experience complications related to bowel motility Outcome: Progressing Goal: Will not experience complications related to urinary retention Outcome: Progressing   Problem: Pain Managment: Goal: General experience of comfort will improve and/or be controlled Outcome: Progressing   Problem: Safety: Goal: Ability to remain free from injury will improve Outcome: Progressing   Problem: Skin Integrity: Goal: Risk for impaired skin integrity will decrease Outcome: Progressing   Problem: Education: Goal: Knowledge of Childbirth will improve Outcome: Progressing Goal: Ability to make informed decisions regarding treatment and plan of care will improve Outcome: Progressing Goal: Ability to state and carry out methods to decrease the pain will improve Outcome: Progressing Goal: Individualized Educational Video(s) Outcome: Progressing   Problem:  Coping: Goal: Ability to verbalize concerns and feelings about labor and delivery will improve Outcome: Progressing   Problem: Life Cycle: Goal: Ability to make normal progression through stages of labor will improve Outcome: Progressing Goal: Ability to effectively push during vaginal delivery will improve Outcome: Progressing   Problem: Role Relationship: Goal: Will demonstrate positive interactions with the child Outcome: Progressing   Problem: Safety: Goal: Risk of complications during labor and delivery will decrease Outcome: Progressing   Problem: Pain Management: Goal: Relief or control of pain from uterine contractions will improve Outcome: Progressing   Problem: Education: Goal: Knowledge of disease or condition will improve Outcome: Progressing Goal: Knowledge of the prescribed therapeutic regimen will improve Outcome: Progressing   Problem: Fluid Volume: Goal: Peripheral tissue perfusion will improve Outcome: Progressing   Problem: Clinical Measurements: Goal: Complications related to disease process, condition or treatment will be avoided or minimized Outcome: Progressing   Problem: Education: Goal: Ability to describe self-care measures that may prevent or decrease complications (Diabetes Survival Skills Education) will improve Outcome: Progressing Goal: Individualized Educational Video(s) Outcome: Progressing   Problem: Coping: Goal: Ability to adjust to condition or change in health will improve Outcome: Progressing   Problem: Fluid Volume: Goal: Ability to maintain a balanced intake and output will improve Outcome: Progressing   Problem: Health Behavior/Discharge Planning: Goal: Ability to identify and utilize available resources and services will improve Outcome: Progressing Goal: Ability to manage health-related needs will improve Outcome: Progressing   Problem: Metabolic: Goal: Ability to maintain appropriate glucose levels will  improve Outcome: Progressing   Problem: Nutritional: Goal: Maintenance of adequate nutrition will improve Outcome: Progressing Goal: Progress toward achieving an optimal weight will improve Outcome: Progressing   Problem: Skin Integrity: Goal: Risk for impaired skin integrity will decrease Outcome: Progressing   Problem: Tissue Perfusion: Goal: Adequacy of tissue perfusion will improve Outcome: Progressing

## 2023-12-31 NOTE — Progress Notes (Signed)
 Labor Progress Note Rita Joseph is a 43 y.o. [phone] at [redacted]w[redacted]d presented for IOL for fetal tachycardia and cHTN with superimposed pre-eclampsia with severe features  S:  Comfortable, napping  O:  BP (!) 148/75   Pulse 81   Temp 98.2 F (36.8 C) (Axillary)   Resp 18   Ht 5' 4 (1.626 m)   Wt 104.3 kg   LMP 04/27/2023 (Exact Date)   SpO2 100%   BMI 39.48 kg/m  EFM: baseline 140 bpm/ minimal variability/ + accels/ - decels  Toco/IUPC: no ctx SVE: Dilation: 5.5 Effacement (%): 60 Station: Ballotable Presentation: Vertex Exam by:: Jomarie, MD   A/P: 43 y.o. [id number] [redacted]w[redacted]d  1. Labor: Plan to restart pitocin and then AROM, patient amenable to plan 2. Pain: Epidural 3. GBS unknown, plan for penicillin for ppx 4. PEC w/ SF: asymptomatic, continues with magnesium and home antihypertensive, blood pressures have been in mild range recently   Charlie DELENA Jomarie, MD 12:52 PM

## 2023-12-31 NOTE — Progress Notes (Signed)
 Labor Progress Note Rita Joseph is a 42 y.o. [phone] at [redacted]w[redacted]d presented for IOL for fetal tachyardia and cHTN with superimposed pre-eclampsia with severe features  S:  Comfortable with epidural, on the phone with her mother  O:  BP (!) 114/50   Pulse 95   Temp 97.7 F (36.5 C) (Oral)   Resp 16   Ht 5' 4 (1.626 m)   Wt 104.3 kg   LMP 04/27/2023 (Exact Date)   SpO2 100%   BMI 39.48 kg/m  EFM: baseline 145 bpm/ minimal variability/ neg accels/ neg decels  Toco/IUPC: ctx q3-34min SVE: Dilation: 5 Effacement (%): 60 Station: Ballotable Presentation: Vertex Exam by:: Dr Magali Pitocin: 32 mu/min  A/P: 43 y.o. [id number] [redacted]w[redacted]d  1. Labor: Continues at 5cm with infant ballotable. Plan for pitocin break and Tums. Discussed restarting pitocin in 2 hours and AROM with FSE to continue progression of labor, patient amenable to plan 2. Pain: Epidural 3. GBS negative 4. PEC: asymptomatic, on magnesium and home Norvasc  dose, has normal to mild range pressures at this time 5. EFM: Moments of moderate variability, but mostly minimal. Magnesium likely contributing to this. Will continue to monitor closely  [name], MD 9:13 AM

## 2024-01-01 ENCOUNTER — Encounter: Admitting: Obstetrics and Gynecology

## 2024-01-01 ENCOUNTER — Ambulatory Visit: Admitting: Licensed Clinical Social Worker

## 2024-01-01 ENCOUNTER — Encounter (HOSPITAL_COMMUNITY): Payer: Self-pay | Admitting: Obstetrics and Gynecology

## 2024-01-01 DIAGNOSIS — O1414 Severe pre-eclampsia complicating childbirth: Secondary | ICD-10-CM

## 2024-01-01 DIAGNOSIS — R69 Illness, unspecified: Secondary | ICD-10-CM

## 2024-01-01 DIAGNOSIS — O09523 Supervision of elderly multigravida, third trimester: Secondary | ICD-10-CM

## 2024-01-01 DIAGNOSIS — Z3A35 35 weeks gestation of pregnancy: Secondary | ICD-10-CM

## 2024-01-01 LAB — CBC
HCT: 33.4 % — ABNORMAL LOW (ref 36.0–46.0)
HCT: 29.8 % — ABNORMAL LOW (ref 36.0–46.0)
HCT: 29 % — ABNORMAL LOW (ref 36.0–46.0)
HCT: 26.9 % — ABNORMAL LOW (ref 36.0–46.0)
Hemoglobin: 11.6 g/dL — ABNORMAL LOW (ref 12.0–15.0)
Hemoglobin: 9.8 g/dL — ABNORMAL LOW (ref 12.0–15.0)
Hemoglobin: 10.1 g/dL — ABNORMAL LOW (ref 12.0–15.0)
Hemoglobin: 9.3 g/dL — ABNORMAL LOW (ref 12.0–15.0)
MCH: 33 pg (ref 26.0–34.0)
MCH: 30.9 pg (ref 26.0–34.0)
MCH: 32.8 pg (ref 26.0–34.0)
MCH: 32.9 pg (ref 26.0–34.0)
MCHC: 34.7 g/dL (ref 30.0–36.0)
MCHC: 32.9 g/dL (ref 30.0–36.0)
MCHC: 34.8 g/dL (ref 30.0–36.0)
MCHC: 34.6 g/dL (ref 30.0–36.0)
MCV: 95.2 fL (ref 80.0–100.0)
MCV: 94 fL (ref 80.0–100.0)
MCV: 94.2 fL (ref 80.0–100.0)
MCV: 95.1 fL (ref 80.0–100.0)
Platelets: 176 K/uL (ref 150–400)
Platelets: 186 K/uL (ref 150–400)
Platelets: 197 K/uL (ref 150–400)
Platelets: 200 K/uL (ref 150–400)
RBC: 3.51 MIL/uL — ABNORMAL LOW (ref 3.87–5.11)
RBC: 3.17 MIL/uL — ABNORMAL LOW (ref 3.87–5.11)
RBC: 3.08 MIL/uL — ABNORMAL LOW (ref 3.87–5.11)
RBC: 2.83 MIL/uL — ABNORMAL LOW (ref 3.87–5.11)
RDW: 15.3 % (ref 11.5–15.5)
RDW: 14.6 % (ref 11.5–15.5)
RDW: 14.6 % (ref 11.5–15.5)
RDW: 14.5 % (ref 11.5–15.5)
WBC: 13.9 K/uL — ABNORMAL HIGH (ref 4.0–10.5)
WBC: 13.8 K/uL — ABNORMAL HIGH (ref 4.0–10.5)
WBC: 14.8 K/uL — ABNORMAL HIGH (ref 4.0–10.5)
WBC: 14.1 K/uL — ABNORMAL HIGH (ref 4.0–10.5)
nRBC: 0 % (ref 0.0–0.2)
nRBC: 0 % (ref 0.0–0.2)
nRBC: 0 % (ref 0.0–0.2)
nRBC: 0.1 % (ref 0.0–0.2)

## 2024-01-01 LAB — COMPREHENSIVE METABOLIC PANEL WITH GFR
ALT: 49 U/L — ABNORMAL HIGH (ref 0–44)
ALT: 43 U/L (ref 0–44)
ALT: 41 U/L (ref 0–44)
AST: 52 U/L — ABNORMAL HIGH (ref 15–41)
AST: 38 U/L (ref 15–41)
AST: 35 U/L (ref 15–41)
Albumin: 2.2 g/dL — ABNORMAL LOW (ref 3.5–5.0)
Albumin: 2.2 g/dL — ABNORMAL LOW (ref 3.5–5.0)
Albumin: 2.1 g/dL — ABNORMAL LOW (ref 3.5–5.0)
Alkaline Phosphatase: 76 U/L (ref 38–126)
Alkaline Phosphatase: 68 U/L (ref 38–126)
Alkaline Phosphatase: 66 U/L (ref 38–126)
Anion gap: 13 (ref 5–15)
Anion gap: 9 (ref 5–15)
Anion gap: 3 — ABNORMAL LOW (ref 5–15)
BUN: <5 mg/dL — ABNORMAL LOW (ref 6–20)
BUN: <5 mg/dL — ABNORMAL LOW (ref 6–20)
BUN: <5 mg/dL — ABNORMAL LOW (ref 6–20)
CO2: 19 mmol/L — ABNORMAL LOW (ref 22–32)
CO2: 21 mmol/L — ABNORMAL LOW (ref 22–32)
CO2: 24 mmol/L (ref 22–32)
Calcium: 6.7 mg/dL — ABNORMAL LOW (ref 8.9–10.3)
Calcium: 6.6 mg/dL — ABNORMAL LOW (ref 8.9–10.3)
Calcium: 6.3 mg/dL — CL (ref 8.9–10.3)
Chloride: 99 mmol/L (ref 98–111)
Chloride: 99 mmol/L (ref 98–111)
Chloride: 102 mmol/L (ref 98–111)
Creatinine, Ser: 0.79 mg/dL (ref 0.44–1.00)
Creatinine, Ser: 0.78 mg/dL (ref 0.44–1.00)
Creatinine, Ser: 0.87 mg/dL (ref 0.44–1.00)
GFR, Estimated: >60 mL/min (ref >60)
GFR, Estimated: >60 mL/min (ref >60)
GFR, Estimated: >60 mL/min (ref >60)
Glucose, Bld: 231 mg/dL — ABNORMAL HIGH (ref 70–99)
Glucose, Bld: 152 mg/dL — ABNORMAL HIGH (ref 70–99)
Glucose, Bld: 176 mg/dL — ABNORMAL HIGH (ref 70–99)
Potassium: 4.2 mmol/L (ref 3.5–5.1)
Potassium: 4.2 mmol/L (ref 3.5–5.1)
Potassium: 4.3 mmol/L (ref 3.5–5.1)
Sodium: 131 mmol/L — ABNORMAL LOW (ref 135–145)
Sodium: 129 mmol/L — ABNORMAL LOW (ref 135–145)
Sodium: 129 mmol/L — ABNORMAL LOW (ref 135–145)
Total Bilirubin: 0.7 mg/dL (ref 0.0–1.2)
Total Bilirubin: 0.5 mg/dL (ref 0.0–1.2)
Total Bilirubin: 0.5 mg/dL (ref 0.0–1.2)
Total Protein: 5.2 g/dL — ABNORMAL LOW (ref 6.5–8.1)
Total Protein: 5 g/dL — ABNORMAL LOW (ref 6.5–8.1)
Total Protein: 4.9 g/dL — ABNORMAL LOW (ref 6.5–8.1)

## 2024-01-01 LAB — MAGNESIUM: Magnesium: 6.2 mg/dL (ref 1.7–2.4)

## 2024-01-01 LAB — GLUCOSE, CAPILLARY
Glucose-Capillary: 112 mg/dL — ABNORMAL HIGH (ref 70–99)
Glucose-Capillary: 136 mg/dL — ABNORMAL HIGH (ref 70–99)
Glucose-Capillary: 193 mg/dL — ABNORMAL HIGH (ref 70–99)

## 2024-01-01 LAB — FIBRINOGEN: Fibrinogen: 502 mg/dL — ABNORMAL HIGH (ref 210–475)

## 2024-01-01 MED ORDER — ONDANSETRON HCL 4 MG/2ML IJ SOLN: 4.0000 mg | INTRAMUSCULAR | Status: DC | PRN

## 2024-01-01 MED ORDER — WITCH HAZEL-GLYCERIN EX PADS: 1.0000 | MEDICATED_PAD | CUTANEOUS | Status: DC | PRN

## 2024-01-01 MED ORDER — DIBUCAINE (PERIANAL) 1 % EX OINT: 1.0000 | TOPICAL_OINTMENT | CUTANEOUS | Status: DC | PRN

## 2024-01-01 MED ORDER — POTASSIUM CHLORIDE CRYS ER 20 MEQ PO TBCR
20.0000 meq | EXTENDED_RELEASE_TABLET | Freq: Every day | ORAL | Status: DC
  Administered 2024-01-01 – 2024-01-03 (×3): 20 meq via ORAL
  Filled 2024-01-01 (×3): qty 1

## 2024-01-01 MED ORDER — IBUPROFEN 600 MG PO TABS
600.0000 mg | ORAL_TABLET | Freq: Four times a day (QID) | ORAL | Status: DC
  Administered 2024-01-01 – 2024-01-03 (×7): 600 mg via ORAL
  Filled 2024-01-01 (×9): qty 1

## 2024-01-01 MED ORDER — TRANEXAMIC ACID-NACL 1000-0.7 MG/100ML-% IV SOLN
1000.0000 mg | INTRAVENOUS | Status: AC
  Administered 2024-01-01: 1000 mg via INTRAVENOUS

## 2024-01-01 MED ORDER — SIMETHICONE 80 MG PO CHEW: 80.0000 mg | CHEWABLE_TABLET | ORAL | Status: DC | PRN

## 2024-01-01 MED ORDER — DIPHENHYDRAMINE HCL 25 MG PO CAPS: 25.0000 mg | ORAL_CAPSULE | Freq: Four times a day (QID) | ORAL | Status: DC | PRN

## 2024-01-01 MED ORDER — CYCLOBENZAPRINE HCL 10 MG PO TABS
5.0000 mg | ORAL_TABLET | Freq: Once | ORAL | Status: AC
  Administered 2024-01-01: 5 mg via ORAL
  Filled 2024-01-01: qty 1

## 2024-01-01 MED ORDER — PRENATAL MULTIVITAMIN CH
1.0000 | ORAL_TABLET | Freq: Every day | ORAL | Status: DC
  Administered 2024-01-01 – 2024-01-02 (×2): 1 via ORAL
  Filled 2024-01-01 (×2): qty 1

## 2024-01-01 MED ORDER — FUROSEMIDE 20 MG PO TABS
20.0000 mg | ORAL_TABLET | Freq: Every day | ORAL | Status: DC
  Administered 2024-01-01 – 2024-01-03 (×3): 20 mg via ORAL
  Filled 2024-01-01 (×3): qty 1

## 2024-01-01 MED ORDER — TETANUS-DIPHTH-ACELL PERTUSSIS 5-2-15.5 LF-MCG/0.5 IM SUSP: 0.5000 mL | Freq: Once | INTRAMUSCULAR | Status: DC

## 2024-01-01 MED ORDER — COCONUT OIL OIL: 1.0000 | TOPICAL_OIL | Status: DC | PRN

## 2024-01-01 MED ORDER — BENZOCAINE-MENTHOL 20-0.5 % EX AERO: 1.0000 | INHALATION_SPRAY | CUTANEOUS | Status: DC | PRN

## 2024-01-01 MED ORDER — LACTATED RINGERS IV BOLUS
500.0000 mL | Freq: Once | INTRAVENOUS | Status: AC
  Administered 2024-01-01: 500 mL via INTRAVENOUS

## 2024-01-01 MED ORDER — ONDANSETRON HCL 4 MG PO TABS: 4.0000 mg | ORAL_TABLET | ORAL | Status: DC | PRN

## 2024-01-01 MED ORDER — TRANEXAMIC ACID-NACL 1000-0.7 MG/100ML-% IV SOLN
INTRAVENOUS | Status: AC
  Filled 2024-01-01: qty 100

## 2024-01-01 MED ORDER — ZOLPIDEM TARTRATE 5 MG PO TABS: 5.0000 mg | ORAL_TABLET | Freq: Every evening | ORAL | Status: DC | PRN

## 2024-01-01 MED ORDER — CALCIUM GLUCONATE-NACL 1-0.675 GM/50ML-% IV SOLN
1.0000 g | Freq: Once | INTRAVENOUS | Status: AC
  Administered 2024-01-01: 1000 mg via INTRAVENOUS
  Filled 2024-01-01: qty 50

## 2024-01-01 MED ORDER — ACETAMINOPHEN 325 MG PO TABS: 650.0000 mg | ORAL_TABLET | ORAL | Status: DC | PRN

## 2024-01-01 MED ORDER — SENNOSIDES-DOCUSATE SODIUM 8.6-50 MG PO TABS
2.0000 | ORAL_TABLET | ORAL | Status: DC
  Administered 2024-01-01 – 2024-01-03 (×3): 2 via ORAL
  Filled 2024-01-01 (×3): qty 2

## 2024-01-01 MED ORDER — METHYLERGONOVINE MALEATE 0.2 MG PO TABS
0.2000 mg | ORAL_TABLET | Freq: Once | ORAL | Status: AC
  Administered 2024-01-01: 0.2 mg via ORAL
  Filled 2024-01-01: qty 1

## 2024-01-01 NOTE — Discharge Summary (Signed)
 Postpartum Discharge Summary       Patient Name: Rita Joseph DOB: 1980-11-20 MRN: [id number]  Date of admission: 12/30/2023 Delivery date:01/01/2024 Delivering provider: SYNTHIA RAISIN Date of discharge: 01/03/2024  Admitting diagnosis: Pre-eclampsia, severe, third trimester [O14.13] Intrauterine pregnancy: [redacted]w[redacted]d     Secondary diagnosis:  Principal Problem:   Preeclampsia, severe, third trimester Active Problems:   AMA (advanced maternal age) multigravida 35+, second trimester   Chronic hypertension affecting pregnancy   Gestational diabetes mellitus (GDM) affecting pregnancy, antepartum   Vaginal delivery   Preterm delivery after induction of labor  Additional problems: h/o pp depression    Discharge diagnosis: Preterm Pregnancy Delivered, Preeclampsia (severe), CHTN with superimposed preeclampsia, GDM A2, Anemia, and PPH      Acute on chronic acute blood loss anemia                                         Post partum procedures:Nexplanon  placement Augmentation: AROM and Pitocin  Complications: Hemorrhage>1049mL  Hospital course: Induction of Labor With Vaginal Delivery   43 y.o. yo [id number] at [redacted]w[redacted]d was admitted to the hospital 12/30/2023 for induction of labor.  Indication for induction: Preeclampsia, A2 DM, and Fetal Tachycardia.  Patient had an labor course complicated by above as well as obesity.  Membrane Rupture Time/Date: 4:05 PM,12/31/2023  Delivery Method:Vaginal, Spontaneous Operative Delivery:N/A Episiotomy: None Lacerations:  None Details of delivery can be found in separate delivery note.  Patient had a postpartum course complicated by Magnesium  sulfate x 24 hours, treated with Procardia, lasix  and potassium. BP ok. Initially posted for pp BTL, declined and had Nexplanon  placed. Pp CBGs WNL. Hgb dropped to 7.5. Pt. Without significant symptoms. Placed on discharge iron therapy.Patient is discharged home 01/03/24.  Newborn Data: Birth date:01/01/2024 Birth  time:1:17 AM Gender:Female-Journi Living status:Living Apgars:9 ,9  Weight:3210 g  Magnesium  Sulfate received: Yes: Seizure prophylaxis BMZ received: No Rhophylac:N/A MMR:N/A T-DaP:Needs Flu: No RSV Vaccine received: No Transfusion:No  Immunizations received: Immunization History  Administered Date(s) Administered   PFIZER(Purple Top)SARS-COV-2 Vaccination 08/13/2019, 09/08/2019    Physical exam  Vitals:   01/02/24 1943 01/02/24 2326 01/03/24 0356 01/03/24 0827  BP: 118/74 114/65 122/63 (!) 143/97  Pulse: (!) 109 (!) 105 83 100  Resp: 18 17 18 18   Temp: 98.6 F (37 C) 98 F (36.7 C) 98 F (36.7 C) 98.2 F (36.8 C)  TempSrc: Oral Oral Oral Oral  SpO2: 98% 96% 97% 100%  Weight:      Height:       General: alert, cooperative, and no distress Lochia: appropriate Uterine Fundus: firm DVT Evaluation: No evidence of DVT seen on physical exam. Labs: Lab Results  Component Value Date   WBC 10.2 01/02/2024   HGB 7.5 (L) 01/02/2024   HCT 21.4 (L) 01/02/2024   MCV 96.0 01/02/2024   PLT 179 01/02/2024      Latest Ref Rng & Units 01/02/2024    4:42 AM  CMP  Glucose 70 - 99 mg/dL 97   BUN 6 - 20 mg/dL 5   Creatinine 9.55 - 8.99 mg/dL 9.28   Sodium 864 - 854 mmol/L 136   Potassium 3.5 - 5.1 mmol/L 4.0   Chloride 98 - 111 mmol/L 109   CO2 22 - 32 mmol/L 22   Calcium  8.9 - 10.3 mg/dL 6.4   Total Protein 6.5 - 8.1 g/dL 4.2   Total Bilirubin 0.0 -  1.2 mg/dL 0.4   Alkaline Phos 38 - 126 U/L 50   AST 15 - 41 U/L 24   ALT 0 - 44 U/L 32    Edinburgh Score:    01/02/2024    3:45 AM  Edinburgh Postnatal Depression Scale Screening Tool  I have been able to laugh and see the funny side of things. 0  I have looked forward with enjoyment to things. 0  I have blamed myself unnecessarily when things went wrong. 0  I have been anxious or worried for no good reason. 0  I have felt scared or panicky for no good reason. 1  Things have been getting on top of me. 1  I have been  so unhappy that I have had difficulty sleeping. 1  I have felt sad or miserable. 1  I have been so unhappy that I have been crying. 0  The thought of harming myself has occurred to me. 0  Edinburgh Postnatal Depression Scale Total 4   Edinburgh Postnatal Depression Scale Total: 4   After visit meds:  Allergies as of 01/03/2024   No Known Allergies      Medication List     STOP taking these medications    metFORMIN  500 MG tablet Commonly known as: GLUCOPHAGE    metroNIDAZOLE  500 MG tablet Commonly known as: FLAGYL        TAKE these medications    Accu-Chek Guide w/Device Kit 1 Device by Does not apply route 4 (four) times daily. Check blood sugars four times daily; when waking up, and 2 hours after breakfast, lunch, and dinner.   Accu-Chek Softclix Lancets lancets Check blood sugars four times daily; when waking up, and 2 hours after breakfast, lunch, and dinner.   amLODipine  5 MG tablet Commonly known as: NORVASC  Take 1 tablet (5 mg total) by mouth daily. What changed: Another medication with the same name was changed. Make sure you understand how and when to take each.   amLODipine  10 MG tablet Commonly known as: Norvasc  Take 1 tablet (10 mg total) by mouth daily. What changed:  medication strength how much to take   aspirin  EC 81 MG tablet Take 1 tablet (81 mg total) by mouth daily. Take after 12 weeks for prevention of preeclampsia later in pregnancy   cyclobenzaprine  10 MG tablet Commonly known as: FLEXERIL  Take 1 tablet (10 mg total) by mouth every 8 (eight) hours as needed for muscle spasms.   Dexcom G7 Sensor Misc 1 Application by Does not apply route every 3 (three) days.   ferrous sulfate  325 (65 FE) MG tablet Take 1 tablet (325 mg total) by mouth every other day.   FLUoxetine  20 MG capsule Commonly known as: PROzac  Take 1 capsule (20 mg total) by mouth daily.   furosemide  20 MG tablet Commonly known as: LASIX  Take 1 tablet (20 mg total) by  mouth daily for 5 days.   glucose blood test strip Check blood sugars four times daily; when waking up, and 2 hours after breakfast, lunch, and dinner.   hydrochlorothiazide  25 MG tablet Commonly known as: HYDRODIURIL  Take 1 tablet (25 mg total) by mouth daily. Resume after lasix  is done What changed: additional instructions   potassium chloride  SA 20 MEQ tablet Commonly known as: KLOR-CON  M Take 1 tablet (20 mEq total) by mouth daily for 5 days.   selenium  sulfide 2.5 % lotion Commonly known as: SELSUN  Apply 1 Application topically daily as needed for irritation.  Discharge home in stable condition Infant Feeding: Bottle Infant Disposition:NICU Discharge instruction: per After Visit Summary and Postpartum booklet. Activity: Advance as tolerated. Pelvic rest for 6 weeks.  Diet: routine diet Future Appointments: Future Appointments  Date Time Provider Department Center  01/08/2024 10:40 AM [facility] NURSE [facility] None  02/12/2024  8:30 AM [facility] LAB [facility] None  02/12/2024  9:35 AM Davis, Devon E, PA-C [facility] None   Follow up Visit:  Follow-up Information     [facility] for [facility] Follow up in 1 week(s).   Specialty: Obstetrics and Gynecology Why: blood pressure check Contact information: [address], [address] [place]  [address] [phone]               Message Sent   Please schedule this patient for a In person postpartum visit in 6 weeks with the following provider: Any provider. Additional Postpartum F/U:Postpartum Depression checkup, 2 hour GTT, and BP check 1 week  High risk pregnancy complicated by: GDM, HTN, and Obesity Delivery mode:  Vaginal, Spontaneous Anticipated Birth Control:  BTL   01/03/2024 Glenys GORMAN Birk, MD

## 2024-01-01 NOTE — Lactation Note (Signed)
 This note was copied from a baby's chart. Lactation Consultation Note  Patient Name: Rita Joseph Unijb'd Date: 01/01/2024 Age:42 hours  Per RN and NP, mother has decided to only formula feed.    Consult Status Consult Status: Complete (mom has decided to only formula feed)    Joshua Line M 01/01/2024, 1:51 PM

## 2024-01-01 NOTE — Anesthesia Postprocedure Evaluation (Signed)
 Anesthesia Post Note  Patient: Rita Joseph  Procedure(s) Performed: AN AD HOC LABOR EPIDURAL     Patient location during evaluation: Mother Baby Anesthesia Type: Epidural Level of consciousness: awake and alert Pain management: pain level controlled Vital Signs Assessment: post-procedure vital signs reviewed and stable Respiratory status: spontaneous breathing, nonlabored ventilation and respiratory function stable Cardiovascular status: stable Postop Assessment: no headache, no backache and epidural receding Anesthetic complications: no   No notable events documented.  Last Vitals:  Vitals:   01/01/24 0715 01/01/24 0853  BP: 115/81 112/76  Pulse: 95 96  Resp:  17  Temp:  36.8 C  SpO2: 99% 100%    Last Pain:  Vitals:   01/01/24 0853  TempSrc: Oral  PainSc:    Pain Goal:                   Payton Moder

## 2024-01-01 NOTE — Progress Notes (Signed)
 Pt called out at 0611 stating she was not feeling well. The Patient said her vision was blurry and her hearing was going in an out. Dr Zina called at [phone], and rapid response called at [phone]. Patients pad was fully soaked in one hour, this nurse began a fundal massage and getting vitals. Fundal Massage expelled large clot. EBL after clot was 341. IV team present in room and extra IV placed. CBC and Fibrinogen labs drawn by IV team. Dr Zina asked for the patients Magnesium Sulfate to be stopped and for a 500cc bolus to be given.

## 2024-01-01 NOTE — Progress Notes (Addendum)
 CTSP pt regarding possible postpartum hemorrhage.  Rapid response team already in room gaining IV access.  With fundal rub a large clot had been expressed,  Upon my arrival there was no active bleeding.  Pulse currently 109.  EBL for the in room event was 341 ml   .   O:  fundus firm, but deep due to obesity.   Cbc pending Fibrinogen pending A/P:  postpartum hemorrhage Hold magnesium sulfate to give 500 cc bolus of fluid then restart mag. Follow up on labs. Even though pt has preeclampsia, will give one time dose of methergine as pt has normal to low range BP currently.   Jerilynn Buddle, MD

## 2024-01-01 NOTE — Plan of Care (Signed)
 Problem: Education: Goal: Knowledge of General Education information will improve Description: Including pain rating scale, medication(s)/side effects and non-pharmacologic comfort measures Outcome: Progressing   Problem: Health Behavior/Discharge Planning: Goal: Ability to manage health-related needs will improve Outcome: Progressing   Problem: Clinical Measurements: Goal: Ability to maintain clinical measurements within normal limits will improve Outcome: Progressing Goal: Will remain free from infection Outcome: Progressing Goal: Diagnostic test results will improve Outcome: Progressing Goal: Respiratory complications will improve Outcome: Progressing Goal: Cardiovascular complication will be avoided Outcome: Progressing   Problem: Activity: Goal: Risk for activity intolerance will decrease Outcome: Progressing   Problem: Nutrition: Goal: Adequate nutrition will be maintained Outcome: Progressing   Problem: Coping: Goal: Level of anxiety will decrease Outcome: Progressing   Problem: Elimination: Goal: Will not experience complications related to bowel motility Outcome: Progressing Goal: Will not experience complications related to urinary retention Outcome: Progressing   Problem: Pain Managment: Goal: General experience of comfort will improve and/or be controlled Outcome: Progressing   Problem: Safety: Goal: Ability to remain free from injury will improve Outcome: Progressing   Problem: Skin Integrity: Goal: Risk for impaired skin integrity will decrease Outcome: Progressing   Problem: Education: Goal: Knowledge of Childbirth will improve Outcome: Progressing Goal: Ability to make informed decisions regarding treatment and plan of care will improve Outcome: Progressing Goal: Ability to state and carry out methods to decrease the pain will improve Outcome: Progressing Goal: Individualized Educational Video(s) Outcome: Progressing   Problem:  Coping: Goal: Ability to verbalize concerns and feelings about labor and delivery will improve Outcome: Progressing   Problem: Life Cycle: Goal: Ability to make normal progression through stages of labor will improve Outcome: Progressing Goal: Ability to effectively push during vaginal delivery will improve Outcome: Progressing   Problem: Role Relationship: Goal: Will demonstrate positive interactions with the child Outcome: Progressing   Problem: Safety: Goal: Risk of complications during labor and delivery will decrease Outcome: Progressing   Problem: Pain Management: Goal: Relief or control of pain from uterine contractions will improve Outcome: Progressing   Problem: Education: Goal: Knowledge of disease or condition will improve Outcome: Progressing Goal: Knowledge of the prescribed therapeutic regimen will improve Outcome: Progressing   Problem: Fluid Volume: Goal: Peripheral tissue perfusion will improve Outcome: Progressing   Problem: Clinical Measurements: Goal: Complications related to disease process, condition or treatment will be avoided or minimized Outcome: Progressing   Problem: Education: Goal: Ability to describe self-care measures that may prevent or decrease complications (Diabetes Survival Skills Education) will improve Outcome: Progressing Goal: Individualized Educational Video(s) Outcome: Progressing   Problem: Coping: Goal: Ability to adjust to condition or change in health will improve Outcome: Progressing   Problem: Fluid Volume: Goal: Ability to maintain a balanced intake and output will improve Outcome: Progressing   Problem: Health Behavior/Discharge Planning: Goal: Ability to identify and utilize available resources and services will improve Outcome: Progressing Goal: Ability to manage health-related needs will improve Outcome: Progressing   Problem: Metabolic: Goal: Ability to maintain appropriate glucose levels will  improve Outcome: Progressing   Problem: Nutritional: Goal: Maintenance of adequate nutrition will improve Outcome: Progressing Goal: Progress toward achieving an optimal weight will improve Outcome: Progressing   Problem: Skin Integrity: Goal: Risk for impaired skin integrity will decrease Outcome: Progressing   Problem: Tissue Perfusion: Goal: Adequacy of tissue perfusion will improve Outcome: Progressing   Problem: Education: Goal: Knowledge of condition will improve Outcome: Progressing Goal: Individualized Educational Video(s) Outcome: Progressing Goal: Individualized Newborn Educational Video(s) Outcome: Progressing   Problem:  Activity: Goal: Will verbalize the importance of balancing activity with adequate rest periods Outcome: Progressing Goal: Ability to tolerate increased activity will improve Outcome: Progressing   Problem: Coping: Goal: Ability to identify and utilize available resources and services will improve Outcome: Progressing

## 2024-01-01 NOTE — BH Specialist Note (Signed)
 Integrated Behavioral Health via Telemedicine Visit  01/06/2024 Rita Joseph [id number]  Number of Integrated Behavioral Health Clinician visits: 3- Third Visit  Session Start time: 1045   Session End time: 1105  Total time in minutes: 20    Referring Provider: Dr. Zina, MD Patient/Family location: Home Rita Joseph Provider location: Remote Office All persons participating in visit: Patient and Rita Joseph Types of Service: Individual psychotherapy and Video visit  I connected with Rita Joseph patient via  Telephone or [employer], [employer] (consulting)  (Video is Caregility application) and verified that I am speaking with the correct person using two identifiers. Discussed confidentiality: Yes   I discussed the limitations of telemedicine and the availability of in person appointments.  Discussed there is a possibility of technology failure and discussed alternative modes of communication if that failure occurs.  I discussed that engaging in this telemedicine visit, they consent to the provision of behavioral healthcare and the services will be billed under their insurance.  Patient and/or legal guardian expressed understanding and consented to Telemedicine visit: Yes   Presenting Concerns: Patient and/or family reports the following symptoms/concerns: Recently given birth, currently admitted in the [facility].  Duration of problem: Months; Severity of problem: moderate  Patient and/or Family's Strengths/Protective Factors: Social and Emotional competence, Concrete supports in place (healthy food, safe environments, etc.), and Caregiver has knowledge of parenting & child development  Goals Addressed: Patient will:  Reduce symptoms of: anxiety and depression   Increase knowledge and/or ability of: coping skills and healthy habits   Demonstrate ability to: Increase healthy adjustment to current life circumstances and Increase adequate support systems for  patient/family  Progress towards Goals: Ongoing    Interventions: Interventions utilized:  Supportive Counseling, Communication Skills, and Supportive Reflection Standardized Assessments completed: Not Needed    Patient and/or Family Response:  The patient was present for today's virtual session and reported that she is currently hospitalized following the birth of her baby girl at approximately [redacted] weeks gestation. She shared that her daughter, whom her sister named Journey, was born this morning weighing 7 pounds 1 ounce and is doing well. The patient noted that she initially experienced significant blood loss, accompanied by lightheadedness and dizziness, but is currently feeling stable. She stated that she plans to follow up and schedule a behavioral health appointment after she is discharged.    Clinical Assessment/Diagnosis  Diagnosis deferred    Assessment: Patient currently experiencing postpartum recovery after delivering her baby prematurely, including initial symptoms of blood loss, dizziness, and lightheadedness. She is also processing the immediate emotional and physical adjustments following childbirth while remaining focused on stabilizing and planning for follow-up care..   Patient may benefit from continued support of integrated behavioral health services.  Plan: Follow up with behavioral health clinician on : Patient will schedule appointment after being discharged from the [facility].  Behavioral recommendations: Patient to schedule follow up appointment after [facility] discharge.  Referral(s): Integrated [employer] (In Clinic)  I discussed the assessment and treatment plan with the patient and/or parent/guardian. They were provided an opportunity to ask questions and all were answered. They agreed with the plan and demonstrated an understanding of the instructions.   They were advised to call back or seek an in-person evaluation if the symptoms worsen  or if the condition fails to improve as anticipated.  Rita Joseph, LCSWA

## 2024-01-01 NOTE — Progress Notes (Signed)
 OB Note Patient desires to eat and would like BTL tomorrow. NPO after MN and pt posted for 10am on 12/4  Rita Izell Raddle MD Attending Center for [employer] (Faculty Practice) 01/01/2024 Time: 1300

## 2024-01-02 ENCOUNTER — Inpatient Hospital Stay (HOSPITAL_COMMUNITY): Admitting: Anesthesiology

## 2024-01-02 ENCOUNTER — Encounter (HOSPITAL_COMMUNITY): Admission: AD | Disposition: A | Payer: Self-pay | Source: Home / Self Care | Attending: Obstetrics and Gynecology

## 2024-01-02 LAB — COMPREHENSIVE METABOLIC PANEL WITH GFR
ALT: 32 U/L (ref 0–44)
AST: 24 U/L (ref 15–41)
Albumin: 1.8 g/dL — ABNORMAL LOW (ref 3.5–5.0)
Alkaline Phosphatase: 50 U/L (ref 38–126)
Anion gap: 5 (ref 5–15)
BUN: 5 mg/dL — ABNORMAL LOW (ref 6–20)
CO2: 22 mmol/L (ref 22–32)
Calcium: 6.4 mg/dL — CL (ref 8.9–10.3)
Chloride: 109 mmol/L (ref 98–111)
Creatinine, Ser: 0.71 mg/dL (ref 0.44–1.00)
GFR, Estimated: >60 mL/min (ref >60)
Glucose, Bld: 97 mg/dL (ref 70–99)
Potassium: 4 mmol/L (ref 3.5–5.1)
Sodium: 136 mmol/L (ref 135–145)
Total Bilirubin: 0.4 mg/dL (ref 0.0–1.2)
Total Protein: 4.2 g/dL — ABNORMAL LOW (ref 6.5–8.1)

## 2024-01-02 LAB — CBC
HCT: 21.4 % — ABNORMAL LOW (ref 36.0–46.0)
Hemoglobin: 7.5 g/dL — ABNORMAL LOW (ref 12.0–15.0)
MCH: 33.6 pg (ref 26.0–34.0)
MCHC: 35 g/dL (ref 30.0–36.0)
MCV: 96 fL (ref 80.0–100.0)
Platelets: 179 K/uL (ref 150–400)
RBC: 2.23 MIL/uL — ABNORMAL LOW (ref 3.87–5.11)
RDW: 14.7 % (ref 11.5–15.5)
WBC: 10.2 K/uL (ref 4.0–10.5)
nRBC: 0 % (ref 0.0–0.2)

## 2024-01-02 LAB — GLUCOSE, CAPILLARY: Glucose-Capillary: 103 mg/dL — ABNORMAL HIGH (ref 70–99)

## 2024-01-02 SURGERY — LIGATION, FALLOPIAN TUBE, POSTPARTUM
Anesthesia: Choice

## 2024-01-02 MED ORDER — CYCLOBENZAPRINE HCL 10 MG PO TABS
10.0000 mg | ORAL_TABLET | Freq: Three times a day (TID) | ORAL | Status: DC | PRN
  Administered 2024-01-02: 10 mg via ORAL
  Filled 2024-01-02: qty 1

## 2024-01-02 MED ORDER — LIDOCAINE HCL 1 % IJ SOLN
0.0000 mL | Freq: Once | INTRAMUSCULAR | Status: AC | PRN
  Administered 2024-01-02: 8 mL via INTRADERMAL
  Filled 2024-01-02: qty 20

## 2024-01-02 MED ORDER — ETONOGESTREL 68 MG ~~LOC~~ IMPL
68.0000 mg | DRUG_IMPLANT | Freq: Once | SUBCUTANEOUS | Status: AC
  Administered 2024-01-02: 68 mg via SUBCUTANEOUS
  Filled 2024-01-02: qty 1

## 2024-01-02 NOTE — Clinical Social Work Maternal (Signed)
 CLINICAL SOCIAL WORK MATERNAL/CHILD NOTE  Patient Details  Name: Rita Joseph MRN: [id number] Date of Birth: [birth year]  Date:  01/02/2024  Clinical Social Worker Initiating Note:  [place], [place] Date/Time: Initiated:  01/02/24/1403     Child's Name:  Rita Joseph   Biological Parents:  Mother, Father (Father: Penne Joseph)   Need for Interpreter:  None   Reason for Referral:  Behavioral Health Concerns, Other (Comment) (Infant's NICU admission)   Address:  [address] [place], [place] [address]    Phone number:  [phone] (home)     Additional phone number:   Household Members/Support Persons (HM/SP):   Household Member/Support Person 1, Household Member/Support Person 2, Household Member/Support Person 3   HM/SP Name Relationship DOB or Age  HM/SP -1 Penne Joseph FOB/Husband    HM/SP -2 Ava Joseph daughter 05/04/14  HM/SP -3 Dorsey Joseph son 01/19/21  HM/SP -4  Ines Rebel  daughter   09/25/01  HM/SP -5        HM/SP -6        HM/SP -7        HM/SP -8          Natural Supports (not living in the home):  Parent, Other (Comment) (Mother in social worker)   Professional Supports: Therapist   Employment:     Type of Work:     Education:  Some Materials Engineer arranged:    Surveyor, Quantity Resources:  Medicaid   Other Resources:      Cultural/Religious Considerations Which May Impact Care:    Strengths:  Ability to meet basic needs  , Pediatrician chosen   Psychotropic Medications:         Pediatrician:    [place] area  Pediatrician List:   [facility] ([address])   [facility]      Pediatrician Fax Number:    Risk Factors/Current Problems:  Mental Health Concerns     Cognitive State:  Able to Concentrate  , Alert  , Goal Oriented     Mood/Affect:  Calm  , Comfortable  , Euthymic  , Interested     CSW  Assessment: CSW met with MOB at bedside to complete assessment. MOB was accompanied by FOB/Husband, her mother in law, her son, and a female guest. CSW introduced self and explained role. MOB was welcoming, open, and granted CSW verbal permission to speak in front of everyone about anything. MOB reported that she resides with FOB/Husband and her older children. MOB shared that her oldest daughter is currently deployed. MOB reported that she is interested in [facility]. CSW offered to complete a [facility] referral, MOB requested that CSW complete the [facility] referral. CSW informed parents about [facility] [employer] [facility]'s [employer] if any assistance is needed to obtain items for infant. MOB reported that a referral for all items would be helpful. CSW agreed to complete [facility] referral. MOB reported that they don't have a car seat for infant. MOB's mother in law inquired about getting a car seat from the hospital. CSW provided information about the hospital car seat program, MOB's mother in law reported that they are interested in getting a car seat from the hospital. CSW agreed to check the inventory and to follow up. CSW inquired about MOB's support system, MOB reported that her mother in law and her dad are supports.   CSW inquired about MOB's  mental health history. 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 MOB described her postpartum depression in 2016 as crying all the time. MOB shared that her postpartum depression in 2022 consisted of irritability and isolating. MOB's mother in law reported that MOB was despondent. MOB was unable to recall exactly how Torion Hulgan her postpartum depression lasted but noted that it lasted for months. MOB reported that she took medication (Prozac ) and shared that it was helpful. CSW inquired about how MOB was feeling emotionally since giving birth, MOB reported that she feels all over the place. CSW acknowledged, normalized, and validated MOB's feelings. MOB  acknowledged this pregnancy/baby as an ongoing stressor. MOB reported that her FOB/Husband and sister have been supportive to assist her with coping. CSW inquired about MOB feeling attached and bonded with infant, MOB reported that she is working on it. MOB reported that she is currently participating in therapy weekly and is interested in restarting her medication. CSW agreed to notify MOB's RN that she is interested in restarting psychotropic medication so MOB's provider can be notified. CSW also encouraged MOB to speak with her provider about her interest in restarting psychotropic medication due to her history of postpartum depression (three times). MOB verbalized understanding. MOB presented calm and did not demonstrate any acute mental health signs/symptoms. CSW assessed for safety, MOB denied SI and HI.   CSW provided education regarding the baby blues period vs. perinatal mood disorders, discussed treatment and gave resources for mental health follow up if concerns arise.  CSW recommends self-evaluation during the postpartum time period using the New Mom Checklist from Postpartum Progress and encouraged MOB to contact a medical professional if symptoms are noted at any time.    CSW and parents discussed infant's NICU admission. CSW informed parents about the NICU, what to expect, and supports available while infant is admitted to the NICU. MOB reported that she has not been updated since infant was admitted to the NICU. CSW asked if MOB was interested in an update from a provider, MOB reported yes. CSW agreed to update the NNP. MOB denied any transportation barriers with visiting infant in the NICU. CSW went over and answered questions about visitation. Parents inquired about if infant has been bathed, CSW encouraged parents to follow up with infant's RN. Parents denied any additional questions about the NICU.   CSW provided review of Sudden Infant Death Syndrome (SIDS) precautions.    CSW inquired  about any additional needs for resources/supports, MOB reported none.   CSW completed [facility]SN referral.  CSW completed [facility] referral. CSW delivered a car seat to infant's room, MOB's mother in law paid $30.  CSW obtained MOB's signature on car seat release form.  CSW updated NNP that parents were interested in a medical update on infant.   CSW will continue to offer resources/supports while infant is admitted to the NICU.   CSW Plan/Description:  Sudden Infant Death Syndrome (SIDS) Education, Perinatal Mood and Anxiety Disorder (PMADs) Education, Psychosocial Support and Ongoing Assessment of Needs, Other Information/Referral to [facility], Other Patient/Family Education    [place], LCSW 01/02/2024, 2:22 PM

## 2024-01-02 NOTE — Progress Notes (Signed)
 Patient ID: Rita Joseph, female   DOB: [birth year], 43 y.o.   MRN: [id number] Called to see patient. She is wary of anesthesia and prefers to not have BTL though she is sure she does not want more children. She prefers Nexplanon.

## 2024-01-02 NOTE — Procedures (Signed)
     GYNECOLOGY OFFICE PROCEDURE NOTE  [name] is a 43 y.o. [id number] here for inpatient Nexplanon insertion.    No other gynecologic concerns.  Nexplanon Insertion Procedure Patient identified, informed consent performed, consent signed.   Patient does understand that irregular bleeding is a very common side effect of this medication. She was advised to have backup contraception for one week after placement. Pregnancy test in clinic today was negative.  Appropriate time out taken.  Patient's left arm was prepped and draped in the usual sterile fashion. The ruler used to measure and mark insertion area.  Patient was prepped with alcohol swab and then injected with 3 ml of 1% lidocaine.  She was prepped with betadine, Nexplanon removed from packaging,  Device confirmed in needle, then inserted full length of needle and withdrawn per handbook instructions. Nexplanon was able to palpated in the patient's arm; patient palpated the insert herself. There was minimal blood loss.  Patient insertion site covered with guaze and a pressure bandage to reduce any bruising.  The patient tolerated the procedure well and was given post procedure instructions.    Jerilynn Buddle, MD, FACOG Obstetrician & Gynecologist, [facility] for [facility], [facility]

## 2024-01-02 NOTE — Progress Notes (Signed)
 Post Partum Day 1 Subjective: no complaints  Objective: Blood pressure 101/62, pulse 89, temperature 98.5 F (36.9 C), temperature source Oral, resp. rate 17, height 5' 4 (1.626 m), weight 104.3 kg, last menstrual period 04/27/2023, SpO2 100%, unknown if currently breastfeeding.  Physical Exam:  General: alert, cooperative, and no distress Lochia: appropriate Uterine Fundus: firm Incision:  DVT Evaluation: No evidence of DVT seen on physical exam.  Recent Labs    01/01/24 1711 01/02/24 0442  HGB 9.3* 7.5*  HCT 26.9* 21.4*    Assessment/Plan: Contraception posted for PP BTL 1000 today   LOS: 3 days   Lynwood Solomons, MD 01/02/2024, 7:09 AM

## 2024-01-03 ENCOUNTER — Other Ambulatory Visit (HOSPITAL_COMMUNITY): Payer: Self-pay

## 2024-01-03 LAB — GLUCOSE, CAPILLARY: Glucose-Capillary: 103 mg/dL — ABNORMAL HIGH (ref 70–99)

## 2024-01-03 MED ORDER — AMLODIPINE BESYLATE 5 MG PO TABS
10.0000 mg | ORAL_TABLET | Freq: Every day | ORAL | 1 refills | Status: AC
  Filled 2024-01-03: qty 60, 30d supply, fill #0

## 2024-01-03 MED ORDER — POTASSIUM CHLORIDE CRYS ER 20 MEQ PO TBCR
20.0000 meq | EXTENDED_RELEASE_TABLET | Freq: Every day | ORAL | 0 refills | Status: AC
  Filled 2024-01-03: qty 5, 5d supply, fill #0

## 2024-01-03 MED ORDER — FERROUS SULFATE 325 (65 FE) MG PO TABS
325.0000 mg | ORAL_TABLET | ORAL | 0 refills | Status: AC
  Filled 2024-01-03: qty 15, 30d supply, fill #0

## 2024-01-03 MED ORDER — FLUOXETINE HCL 20 MG PO CAPS
20.0000 mg | ORAL_CAPSULE | Freq: Every day | ORAL | 1 refills | Status: AC
  Filled 2024-01-03: qty 90, 90d supply, fill #0

## 2024-01-03 MED ORDER — HYDROCHLOROTHIAZIDE 25 MG PO TABS
25.0000 mg | ORAL_TABLET | Freq: Every day | ORAL | 2 refills | Status: DC
  Filled 2024-01-03: qty 30, 30d supply, fill #0

## 2024-01-03 MED ORDER — HYDROCHLOROTHIAZIDE 25 MG PO TABS
25.0000 mg | ORAL_TABLET | Freq: Every day | ORAL | 2 refills | Status: AC
  Filled 2024-01-03: qty 30, 30d supply, fill #0

## 2024-01-03 MED ORDER — FUROSEMIDE 20 MG PO TABS
20.0000 mg | ORAL_TABLET | Freq: Every day | ORAL | 0 refills | Status: AC
  Filled 2024-01-03: qty 5, 5d supply, fill #0

## 2024-01-03 MED ORDER — AMLODIPINE BESYLATE 10 MG PO TABS
10.0000 mg | ORAL_TABLET | Freq: Every day | ORAL | 2 refills | Status: DC
  Filled 2024-01-03: qty 30, 30d supply, fill #0

## 2024-01-03 NOTE — Progress Notes (Signed)
   01/03/24 1203  Departure Condition  Departure Condition Good  Mobility at [employer]  Patient/Caregiver Teaching Teach Back Method Used;Discharge instructions reviewed;Prescriptions reviewed;Follow-up care reviewed;Admission discussed;Pain management discussed;Medications discussed;Patient/caregiver verbalized understanding  Departure Mode With significant other   Pt alert and oriented x4, vital signs and pain stable at time of discharge. Discharge instructions and medications discussed, patient verbalized understanding.

## 2024-01-03 NOTE — Plan of Care (Signed)
 Problem: Education: Goal: Knowledge of General Education information will improve Description: Including pain rating scale, medication(s)/side effects and non-pharmacologic comfort measures Outcome: Adequate for Discharge   Problem: Health Behavior/Discharge Planning: Goal: Ability to manage health-related needs will improve Outcome: Adequate for Discharge   Problem: Clinical Measurements: Goal: Ability to maintain clinical measurements within normal limits will improve Outcome: Adequate for Discharge Goal: Will remain free from infection Outcome: Adequate for Discharge Goal: Diagnostic test results will improve Outcome: Adequate for Discharge Goal: Respiratory complications will improve Outcome: Adequate for Discharge Goal: Cardiovascular complication will be avoided Outcome: Adequate for Discharge   Problem: Activity: Goal: Risk for activity intolerance will decrease Outcome: Adequate for Discharge   Problem: Nutrition: Goal: Adequate nutrition will be maintained Outcome: Adequate for Discharge   Problem: Coping: Goal: Level of anxiety will decrease Outcome: Adequate for Discharge   Problem: Elimination: Goal: Will not experience complications related to bowel motility Outcome: Adequate for Discharge Goal: Will not experience complications related to urinary retention Outcome: Adequate for Discharge   Problem: Pain Managment: Goal: General experience of comfort will improve and/or be controlled Outcome: Adequate for Discharge   Problem: Safety: Goal: Ability to remain free from injury will improve Outcome: Adequate for Discharge   Problem: Skin Integrity: Goal: Risk for impaired skin integrity will decrease Outcome: Adequate for Discharge   Problem: Education: Goal: Knowledge of Childbirth will improve Outcome: Adequate for Discharge Goal: Ability to make informed decisions regarding treatment and plan of care will improve Outcome: Adequate for  Discharge Goal: Ability to state and carry out methods to decrease the pain will improve Outcome: Adequate for Discharge Goal: Individualized Educational Video(s) Outcome: Adequate for Discharge   Problem: Coping: Goal: Ability to verbalize concerns and feelings about labor and delivery will improve Outcome: Adequate for Discharge   Problem: Life Cycle: Goal: Ability to make normal progression through stages of labor will improve Outcome: Adequate for Discharge Goal: Ability to effectively push during vaginal delivery will improve Outcome: Adequate for Discharge   Problem: Role Relationship: Goal: Will demonstrate positive interactions with the child Outcome: Adequate for Discharge   Problem: Safety: Goal: Risk of complications during labor and delivery will decrease Outcome: Adequate for Discharge   Problem: Pain Management: Goal: Relief or control of pain from uterine contractions will improve Outcome: Adequate for Discharge   Problem: Education: Goal: Knowledge of disease or condition will improve Outcome: Adequate for Discharge Goal: Knowledge of the prescribed therapeutic regimen will improve Outcome: Adequate for Discharge   Problem: Fluid Volume: Goal: Peripheral tissue perfusion will improve Outcome: Adequate for Discharge   Problem: Clinical Measurements: Goal: Complications related to disease process, condition or treatment will be avoided or minimized Outcome: Adequate for Discharge   Problem: Education: Goal: Ability to describe self-care measures that may prevent or decrease complications (Diabetes Survival Skills Education) will improve Outcome: Adequate for Discharge Goal: Individualized Educational Video(s) Outcome: Adequate for Discharge   Problem: Coping: Goal: Ability to adjust to condition or change in health will improve Outcome: Adequate for Discharge   Problem: Fluid Volume: Goal: Ability to maintain a balanced intake and output will  improve Outcome: Adequate for Discharge   Problem: Health Behavior/Discharge Planning: Goal: Ability to identify and utilize available resources and services will improve Outcome: Adequate for Discharge Goal: Ability to manage health-related needs will improve Outcome: Adequate for Discharge   Problem: Metabolic: Goal: Ability to maintain appropriate glucose levels will improve Outcome: Adequate for Discharge   Problem: Nutritional: Goal: Maintenance of adequate nutrition  will improve Outcome: Adequate for Discharge Goal: Progress toward achieving an optimal weight will improve Outcome: Adequate for Discharge   Problem: Skin Integrity: Goal: Risk for impaired skin integrity will decrease Outcome: Adequate for Discharge   Problem: Tissue Perfusion: Goal: Adequacy of tissue perfusion will improve Outcome: Adequate for Discharge   Problem: Education: Goal: Knowledge of condition will improve Outcome: Adequate for Discharge Goal: Individualized Educational Video(s) Outcome: Adequate for Discharge Goal: Individualized Newborn Educational Video(s) Outcome: Adequate for Discharge   Problem: Activity: Goal: Will verbalize the importance of balancing activity with adequate rest periods Outcome: Adequate for Discharge Goal: Ability to tolerate increased activity will improve Outcome: Adequate for Discharge   Problem: Coping: Goal: Ability to identify and utilize available resources and services will improve Outcome: Adequate for Discharge

## 2024-01-07 ENCOUNTER — Other Ambulatory Visit

## 2024-01-07 ENCOUNTER — Ambulatory Visit

## 2024-01-08 ENCOUNTER — Ambulatory Visit

## 2024-01-09 ENCOUNTER — Ambulatory Visit: Admitting: Licensed Clinical Social Worker

## 2024-01-09 DIAGNOSIS — F53 Postpartum depression: Secondary | ICD-10-CM

## 2024-01-09 NOTE — BH Specialist Note (Signed)
 Integrated Behavioral Health via Telemedicine Visit  01/14/2024 Rita Joseph [id number]  Number of Integrated Behavioral Health Clinician visits: 4- Fourth Visit  Session Start time: 1345   Session End time: 1451  Total time in minutes: 66    Referring Provider: Dr. Zina, MD Patient/Family location: [facility] Provider location: Remote Office All persons participating in visit: Patient and [facility] Types of Service: Individual psychotherapy and Video visit  I connected with [name] and/or Mylinda List patient via  Telephone or Engineer, Civil (consulting)  (Video is Caregility application) and verified that I am speaking with the correct person using two identifiers. Discussed confidentiality: Yes   I discussed the limitations of telemedicine and the availability of in person appointments.  Discussed there is a possibility of technology failure and discussed alternative modes of communication if that failure occurs.  I discussed that engaging in this telemedicine visit, they consent to the provision of behavioral healthcare and the services will be billed under their insurance.  Patient and/or legal guardian expressed understanding and consented to Telemedicine visit: Yes   Presenting Concerns: Patient and/or family reports the following symptoms/concerns: navigating postpartum  Duration of problem: Months; Severity of problem: moderate  Patient and/or Family's Strengths/Protective Factors: Social and Emotional competence, Concrete supports in place (healthy food, safe environments, etc.), Sense of purpose, Caregiver has knowledge of parenting & child development, and Parental Resilience  Goals Addressed: Patient will:  Reduce symptoms of: anxiety and depression   Increase knowledge and/or ability of: coping skills and healthy habits   Demonstrate ability to: Increase healthy adjustment to current life circumstances and Increase adequate support systems  for patient/family  Progress towards Goals: Ongoing    Interventions: Interventions utilized:  Mindfulness or Management Consultant, Supportive Counseling, Psychoeducation and/or Health Education, Communication Skills, and Supportive Reflection Standardized Assessments completed: Not Needed    Patient and/or Family Response: The patient was present for todays virtual session and actively engaged. She processed her recent childbirth experience, having delivered her daughter on December 3rd at [redacted] weeks gestation. The patient was discharged on December 5th but did not visit her daughter in the NICU until December 9th. During her first visit, she reported distress after observing her daughter with vomit on her face, wet blankets, and a soiled diaper. She addressed her concerns with nursing staff but felt her experience was minimized. The patient stayed in the NICU for two nights despite feeling emotionally overwhelmed and pressured to do so. She also reflected on experiencing a mental breakdown on the day of delivery, noting physical pain from childbirth, emotional distress, and difficulty managing childcare needs for her toddler due to a snow day. The patient shared plans to enroll her toddler in daycare and reported long-term goals of returning to work and seeking employment in 2026.   Clinical Assessment/Diagnosis  Postpartum depression    Assessment: Patient currently experiencing emotional distress related to postpartum adjustment, emotional distress and some anxiety associated with her daughter's NICU experience. She appears overwhelmed but demonstrates insight and motivation to process her emotions and plan for future stability..   Patient may benefit from continued support of integrated behavioral health services.  Plan: Follow up with behavioral health clinician on : 01/16/2024 Behavioral recommendations: patient to continue to process her birth and NICU-related trauma in therapy to  reduce emotional distress and improve coping. She is encouraged to utilize social supports, prioritize rest and recovery, and monitor for symptoms of postpartum mood or anxiety disorders, with continued follow-up and support as needed. Referral(s):  [facility] [employer] (In Clinic)  I discussed the assessment and treatment plan with the patient and/or parent/guardian. They were provided an opportunity to ask questions and all were answered. They agreed with the plan and demonstrated an understanding of the instructions.   They were advised to call back or seek an in-person evaluation if the symptoms worsen or if the condition fails to improve as anticipated.  Yarixa Lightcap LITTIE Seats, LCSWA

## 2024-01-10 ENCOUNTER — Telehealth (HOSPITAL_COMMUNITY): Payer: Self-pay | Admitting: *Deleted

## 2024-01-10 NOTE — Telephone Encounter (Addendum)
 01/10/2024  Name: Rita Joseph MRN: [id number] DOB: July 17, 1980  Reason for Call:  Transition of Care Hospital Discharge Call  Contact Status: Patient Contact Status: Complete  Language assistant needed: Interpreter Mode: Interpreter Not Needed        Follow-Up Questions: Do You Have Any Concerns About Your Health As You Heal From Delivery?: Yes What Concerns Do You Have About Your Health?: Patient reports having left hip pain that is affecting her daily life. Pain began the week before delivery and has persisted, intermittently making it difficult or impossible for her to walk or climb stairs (she lives in two story home).  Encouraged patient to contact OB and ask for appointment and/or ask for referral to PT.  Patient said she would discuss with OB at her next appointment. Encouraged her to ask to be seen sooner if the pain is not improving or is getting worse. Do You Have Any Concerns About Your Infants Health?: No  Edinburgh Postnatal Depression Scale:  In the Past 7 Days: I have been able to laugh and see the funny side of things.: As much as I always could I have looked forward with enjoyment to things.: Rather less than I used to I have blamed myself unnecessarily when things went wrong.: Not very often I have been anxious or worried for no good reason.: No, not at all I have felt scared or panicky for no good reason.: No, not at all Things have been getting on top of me.: Yes, sometimes I haven't been coping as well as usual I have been so unhappy that I have had difficulty sleeping.: Yes, sometimes I have felt sad or miserable.: Yes, quite often I have been so unhappy that I have been crying.: Yes, quite often The thought of harming myself has occurred to me.: Never [place] Postnatal Depression Scale Total: (!) 10  PHQ2-9 Depression Scale:     Discharge Follow-up: Edinburgh score requires follow up?: Yes Provider notified of Edinburgh score?: Yes Have you already been  referred for a counseling appointment?: Yes Date of appointment:: 01/09/24 Time of previous appointment:: 1345 Any barriers for going to appointment?: none If appointment has already passed, how did it go?: appt went well, has another scheduled Patient was advised of the following resources:: Support Group, Breastfeeding Support Group Patient referred to:: OB  Post-discharge interventions: Reviewed Maternal Mental Health Resources  Mliss Sieve, RN 01/10/2024 11:45

## 2024-01-13 NOTE — BH Specialist Note (Incomplete)
 Integrated Behavioral Health via Telemedicine Visit  01/09/2024 Rita Joseph [id number]  Number of Integrated Behavioral Health Clinician visits: 3- Third Visit  Session Start time: 1045   Session End time: 1105  Total time in minutes: 20    Referring Provider: Dr. Zina, MD Patient/Family location: Home [facility] Provider location: Remote Office All persons participating in visit: Patient and [facility] Types of Service: Individual psychotherapy and Video visit  I connected with Rita Joseph and/or Rita Joseph patient via  Telephone or Engineer, Civil (consulting)  (Video is Caregility application) and verified that I am speaking with the correct person using two identifiers. Discussed confidentiality: Yes   I discussed the limitations of telemedicine and the availability of in person appointments.  Discussed there is a possibility of technology failure and discussed alternative modes of communication if that failure occurs.  I discussed that engaging in this telemedicine visit, they consent to the provision of behavioral healthcare and the services will be billed under their insurance.  Patient and/or legal guardian expressed understanding and consented to Telemedicine visit: Yes   Presenting Concerns: Patient and/or family reports the following symptoms/concerns: navigating postpartum  Duration of problem: Months; Severity of problem: moderate  Patient and/or Family's Strengths/Protective Factors: Social and Emotional competence, Concrete supports in place (healthy food, safe environments, etc.), Sense of purpose, Caregiver has knowledge of parenting & child development, and Parental Resilience  Goals Addressed: Patient will:  Reduce symptoms of: anxiety and depression   Increase knowledge and/or ability of: coping skills and healthy habits   Demonstrate ability to: Increase healthy adjustment to current life circumstances and Increase adequate support systems  for patient/family  Progress towards Goals: Ongoing    Interventions: Interventions utilized:  Mindfulness or Management Consultant, Supportive Counseling, Psychoeducation and/or Health Education, Communication Skills, and Supportive Reflection Standardized Assessments completed: Not Needed    Patient and/or Family Response: The patient was present for todays virtual session and actively engaged. She processed her recent childbirth experience, having delivered her daughter on December 3rd at [redacted] weeks gestation. The patient was discharged on December 5th but did not visit her daughter in the NICU until December 9th. During her first visit, she reported distress after observing her daughter with vomit on her face, wet blankets, and a soiled diaper. She addressed her concerns with nursing staff but felt her experience was minimized. The patient stayed in the NICU for two nights despite feeling emotionally overwhelmed and pressured to do so. She also reflected on experiencing a mental breakdown on the day of delivery, noting physical pain from childbirth, emotional distress, and difficulty managing childcare needs for her toddler due to a snow day. The patient shared plans to enroll her toddler in daycare and reported long-term goals of returning to work and seeking employment in 2026.   Clinical Assessment/Diagnosis  Postpartum depression    Assessment: Patient currently experiencing emotional distress related to postpartum adjustment, feelings of guilt, and lingering anxiety associated with her NICU experience. She appears overwhelmed but demonstrates insight and motivation to process her emotions and plan for future stability..   Patient may benefit from continued support of integrated behavioral health services.  Plan: Follow up with behavioral health clinician on : *** Behavioral recommendations: patient to continue to process her birth and NICU-related trauma in therapy to reduce  emotional distress and improve coping. She is encouraged to utilize social supports, prioritize rest and recovery, and monitor for symptoms of postpartum mood or anxiety disorders, with continued follow-up and support as needed. Referral(s):  [facility] [employer] (In Clinic)  I discussed the assessment and treatment plan with the patient and/or parent/guardian. They were provided an opportunity to ask questions and all were answered. They agreed with the plan and demonstrated an understanding of the instructions.   They were advised to call back or seek an in-person evaluation if the symptoms worsen or if the condition fails to improve as anticipated.  Rita Joseph, LCSWA

## 2024-01-16 ENCOUNTER — Ambulatory Visit (INDEPENDENT_AMBULATORY_CARE_PROVIDER_SITE_OTHER): Payer: Self-pay | Admitting: Licensed Clinical Social Worker

## 2024-01-16 DIAGNOSIS — F53 Postpartum depression: Secondary | ICD-10-CM | POA: Diagnosis not present

## 2024-01-20 ENCOUNTER — Encounter: Admitting: Licensed Clinical Social Worker

## 2024-01-20 NOTE — BH Specialist Note (Signed)
 "   Integrated Behavioral Health via Telemedicine Visit  01/20/2024 Rita Joseph [id number]  Number of Integrated Behavioral Health Clinician visits: 4- Fourth Visit  Session Start time: 1345   Session End time: 1451  Total time in minutes: 66    Referring Provider: Dr. Zina, MD Patient/Family location: Home [facility] Provider location: Remote Office All persons participating in visit: Patient and Rita Joseph Types of Service: Individual psychotherapy and Telephone visit  I connected with Rita Joseph and/or Rita Joseph patient via  Telephone or Engineer, Civil (consulting)  (Video is Caregility application) and verified that I am speaking with the correct person using two identifiers. Discussed confidentiality: Yes   I discussed the limitations of telemedicine and the availability of in person appointments.  Discussed there is a possibility of technology failure and discussed alternative modes of communication if that failure occurs.  I discussed that engaging in this telemedicine visit, they consent to the provision of behavioral healthcare and the services will be billed under their insurance.  Patient and/or legal guardian expressed understanding and consented to Telemedicine visit: Yes   Presenting Concerns: Patient and/or family reports the following symptoms/concerns: improvements with mood.  Duration of problem: Months; Severity of problem: moderate  Patient and/or Family's Strengths/Protective Factors: Social and Emotional competence, Concrete supports in place (healthy food, safe environments, etc.), Caregiver has knowledge of parenting & child development, and Parental Resilience  Goals Addressed: Patient will:  Reduce symptoms of: anxiety and depression   Increase knowledge and/or ability of: coping skills, healthy habits, and self-management skills   Demonstrate ability to: Increase healthy adjustment to current life circumstances and Increase adequate  support systems for patient/family  Progress towards Goals: Ongoing    Interventions: Interventions utilized:  Mindfulness or Management Consultant, Supportive Counseling, Psychoeducation and/or Health Education, and Supportive Reflection Standardized Assessments completed: Not Needed    Patient and/or Family Response: The patient was present for todays virtual session and reported an improved mood, stating she is taking things one day at a time. She shared that her toddlers transition to daycare has been helpful and reported that her infant remains in the NICU and is doing well. The patient discussed ongoing physical recovery and noted that she has been requesting additional support with household responsibilities, with her husband increasingly taking initiative to seek help. The session focused on reframing household tasks to feel less burdensome, including engaging in cleaning activities as a family and increasing positive reinforcement toward her partner for tasks completed. Strategies were also discussed to promote collaboration, such as completing tasks together to model expectations. Additionally, the patient engaged in discussion around supporting her oldest daughters emotional regulation by tuning into and validating her feelings.  Clinical Assessment/Diagnosis  Postpartum depression    Assessment: Patient currently experiencing improved mood and increased support within the home while navigating postpartum recovery and the ongoing stress of having a child in the NICU. She is actively adjusting to family changes and engaging in strategies to promote emotional regulation and stability..   Patient may benefit from continued support form integrated behavioral health services.  Plan: Follow up with behavioral health clinician on : 02/03/2023 Behavioral recommendations: patient to continue utilizing open communication and collaborative approaches within the household, including  positive reinforcement and shared task engagement, to reduce stress and enhance family support. She is also encouraged to maintain self-care, allow continued recovery, and practice emotion-focused strategies to support both her own and her childrens emotional regulation. Referral(s): Integrated [employer] (In Clinic)  I discussed  the assessment and treatment plan with the patient and/or parent/guardian. They were provided an opportunity to ask questions and all were answered. They agreed with the plan and demonstrated an understanding of the instructions.   They were advised to call back or seek an in-person evaluation if the symptoms worsen or if the condition fails to improve as anticipated.  Rita Joseph, LCSWA "

## 2024-02-03 ENCOUNTER — Encounter: Payer: Self-pay | Admitting: Licensed Clinical Social Worker

## 2024-02-12 ENCOUNTER — Ambulatory Visit: Attending: Physician Assistant

## 2024-02-12 ENCOUNTER — Ambulatory Visit: Payer: Self-pay | Admitting: Physician Assistant

## 2024-02-12 ENCOUNTER — Other Ambulatory Visit: Payer: Self-pay

## 2024-02-12 ENCOUNTER — Other Ambulatory Visit

## 2024-02-12 ENCOUNTER — Encounter: Payer: Self-pay | Admitting: Physician Assistant

## 2024-02-12 DIAGNOSIS — M6281 Muscle weakness (generalized): Secondary | ICD-10-CM | POA: Diagnosis present

## 2024-02-12 DIAGNOSIS — O24419 Gestational diabetes mellitus in pregnancy, unspecified control: Secondary | ICD-10-CM

## 2024-02-12 DIAGNOSIS — O24415 Gestational diabetes mellitus in pregnancy, controlled by oral hypoglycemic drugs: Secondary | ICD-10-CM | POA: Diagnosis not present

## 2024-02-12 DIAGNOSIS — M5459 Other low back pain: Secondary | ICD-10-CM | POA: Insufficient documentation

## 2024-02-12 DIAGNOSIS — R102 Pelvic and perineal pain unspecified side: Secondary | ICD-10-CM | POA: Insufficient documentation

## 2024-02-12 DIAGNOSIS — M25552 Pain in left hip: Secondary | ICD-10-CM | POA: Insufficient documentation

## 2024-02-12 DIAGNOSIS — M25551 Pain in right hip: Secondary | ICD-10-CM | POA: Insufficient documentation

## 2024-02-12 DIAGNOSIS — I10 Essential (primary) hypertension: Secondary | ICD-10-CM | POA: Diagnosis not present

## 2024-02-12 DIAGNOSIS — R262 Difficulty in walking, not elsewhere classified: Secondary | ICD-10-CM | POA: Insufficient documentation

## 2024-02-12 DIAGNOSIS — D649 Anemia, unspecified: Secondary | ICD-10-CM

## 2024-02-12 DIAGNOSIS — R32 Unspecified urinary incontinence: Secondary | ICD-10-CM

## 2024-02-12 DIAGNOSIS — R252 Cramp and spasm: Secondary | ICD-10-CM

## 2024-02-12 DIAGNOSIS — Z1231 Encounter for screening mammogram for malignant neoplasm of breast: Secondary | ICD-10-CM

## 2024-02-12 NOTE — Progress Notes (Signed)
 "   Post Partum Visit Note  Rita Joseph is a 44 y.o. [phone] female who presents for a postpartum visit. She is 6 weeks postpartum following a normal spontaneous vaginal delivery.  I have fully reviewed the prenatal and intrapartum course. The delivery was at 35 gestational weeks.  Anesthesia: epidural. Postpartum course has been good. Baby is doing well yes, but trouble eating. Baby is feeding by bottle - EleCare. Bleeding staining only. Bowel function is normal. Bladder function is normal. Patient is not sexually active. Contraception method is Nexplanon . Postpartum depression screening: negative.   The pregnancy intention screening data noted above was reviewed. Potential methods of contraception were discussed. The patient elected to proceed with No data recorded.   Edinburgh Postnatal Depression Scale - 02/12/24 1002       Edinburgh Postnatal Depression Scale:  In the Past 7 Days   I have been able to laugh and see the funny side of things. 0    I have looked forward with enjoyment to things. 0    I have blamed myself unnecessarily when things went wrong. 0    I have been anxious or worried for no good reason. 0    I have felt scared or panicky for no good reason. 0    Things have been getting on top of me. 2    I have been so unhappy that I have had difficulty sleeping. 0    I have felt sad or miserable. 0    I have been so unhappy that I have been crying. 0    The thought of harming myself has occurred to me. 0    Edinburgh Postnatal Depression Scale Total 2          Health Maintenance Due  Topic Date Due   FOOT EXAM  Never done   OPHTHALMOLOGY EXAM  Never done   Diabetic kidney evaluation - Urine ACR  Never done   DTaP/Tdap/Td (1 - Tdap) Never done   Pneumococcal Vaccine (1 of 2 - PCV) Never done   Hepatitis B Vaccines 19-59 Average Risk (1 of 3 - 19+ 3-dose series) Never done   HPV VACCINES (1 - 3-dose SCDM series) Never done   Mammogram  Never done   Influenza  Vaccine  Never done    The following portions of the patient's history were reviewed and updated as appropriate: allergies, current medications, past family history, past medical history, past social history, past surgical history, and problem list.  Review of Systems Pertinent items noted in HPI and remainder of comprehensive ROS otherwise negative.  Objective:  BP (!) 167/110   Pulse 72   Ht 5' 4 (1.626 m)   Wt 220 lb 14.4 oz (100.2 kg)   LMP 04/27/2023 (Exact Date)   Breastfeeding No   BMI 37.92 kg/m    General:  alert, cooperative, and appears stated age   Breasts:  not indicated  Lungs: clear to auscultation bilaterally  Heart:  regular rate and rhythm, S1, S2 normal, no murmur, click, rub or gallop  Abdomen: soft, non-tender; bowel sounds normal; no masses,  no organomegaly   Wound Not indicated  GU exam:  not indicated       Assessment:  1. Postpartum care and examination (Primary) Patient doing well, negative Edinburgh on Prozac   20 mg daily. Nexplanon  in place.  - Glucose tolerance, 2 hours; Future - CBC - Comp Met (CMET) - Protein / creatinine ratio, urine  2. Breast cancer screening by mammogram -  MM Digital Screening; Future  3. Urinary incontinence, unspecified type Stress + urge incontinence - Ambulatory referral to Physical Therapy - Ambulatory referral to Urogynecology  4. Chronic hypertension Blood pressure elevated today. Patient has not taken her medication in past two days. We discussed need for consistent use. Will have her return in one week for BP check on medication. Advised patient to make appointment with PCP for continued care for chronic issues.  - CBC - Comp Met (CMET) - Protein / creatinine ratio, urine   5. Gestational diabetes mellitus (GDM), antepartum, gestational diabetes method of control unspecified Discussed fasting for PP GTT - Glucose tolerance, 2 hours; Future  6. Anemia, unspecified type PPH after delivery  12/4 hgb 7.5  on oral iron Asymptomatic   Will re-check CBC today  Plan:   Essential components of care per ACOG recommendations:  1.  Mood and well being: Patient with negative depression screening today. Reviewed local resources for support.  - Patient tobacco use? No.   - hx of drug use? No.    2. Infant care and feeding:  -Patient currently breastmilk feeding? No.  -Social determinants of health (SDOH) reviewed in EPIC. Postpartum depression and housing were identified.   3. Sexuality, contraception and birth spacing - Patient does not want a pregnancy in the next year.  - Reviewed reproductive life planning. Reviewed contraceptive methods based on pt preferences and effectiveness.  Patient had nexplanon  inserted at hospital.  - Discussed birth spacing of 18 months  4. Sleep and fatigue -Encouraged family/partner/community support of 4 hrs of uninterrupted sleep to help with mood and fatigue  5. Physical Recovery  - Discussed patients delivery and complications. She describes her labor as mixed. - Patient had a vaginal delivery with complication including postpartum hemorrhage. Patient had no laceration.  - Patient has urinary incontinence? Yes. Offered PT and patient accepted. Patient was referred to pelvic floor PT.  - Patient is safe to resume physical and sexual activity when she is ready  6.  Health Maintenance - HM due items addressed Yes - Last pap smear  Diagnosis  Date Value Ref Range Status  10/14/2023   Final   - Negative for Intraepithelial Lesions or Malignancy (NILM)  10/14/2023 - Benign reactive/reparative changes  Final   Pap smear not indicated at today's visit.  -Breast Cancer screening indicated? Yes  7. Chronic Disease/Pregnancy Condition follow up: GDM, cHTN  - PCP follow up  Jorene FORBES Moats, PA-C [facility] [employer]  "
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
## 2024-02-12 NOTE — Progress Notes (Signed)
 Pt presents for pp visit. Pt has no questions or concerns at this time.

## 2024-02-12 NOTE — Patient Instructions (Signed)
-  Please return to the office in one week for a blood pressure check and be fasting for your glucose tolerance test -Please schedule a visit with your primary care provider -Expect a phone call from pelvic floor physical therapy and physical therapy for your legs

## 2024-02-13 LAB — CBC
Hematocrit: 34.7 % (ref 34.0–46.6)
Hemoglobin: 10.7 g/dL — ABNORMAL LOW (ref 11.1–15.9)
MCH: 26.1 pg — ABNORMAL LOW (ref 26.6–33.0)
MCHC: 30.8 g/dL — ABNORMAL LOW (ref 31.5–35.7)
MCV: 85 fL (ref 79–97)
Platelets: 375 x10E3/uL (ref 150–450)
RBC: 4.1 x10E6/uL (ref 3.77–5.28)
RDW: 15.4 % (ref 11.7–15.4)
WBC: 6.4 x10E3/uL (ref 3.4–10.8)

## 2024-02-13 LAB — PROTEIN / CREATININE RATIO, URINE
Creatinine, Urine: 96.4 mg/dL
Protein, Ur: 9.3 mg/dL
Protein/Creat Ratio: 96 mg/g{creat} (ref 0–200)

## 2024-02-13 LAB — COMPREHENSIVE METABOLIC PANEL WITH GFR
ALT: 17 IU/L (ref 0–32)
AST: 21 IU/L (ref 0–40)
Albumin: 4.2 g/dL (ref 3.9–4.9)
Alkaline Phosphatase: 85 IU/L (ref 41–116)
BUN/Creatinine Ratio: 11 (ref 9–23)
BUN: 9 mg/dL (ref 6–24)
Bilirubin Total: 0.5 mg/dL (ref 0.0–1.2)
CO2: 19 mmol/L — ABNORMAL LOW (ref 20–29)
Calcium: 9.4 mg/dL (ref 8.7–10.2)
Chloride: 104 mmol/L (ref 96–106)
Creatinine, Ser: 0.79 mg/dL (ref 0.57–1.00)
Globulin, Total: 2.7 g/dL (ref 1.5–4.5)
Glucose: 124 mg/dL — ABNORMAL HIGH (ref 70–99)
Potassium: 4.1 mmol/L (ref 3.5–5.2)
Sodium: 142 mmol/L (ref 134–144)
Total Protein: 6.9 g/dL (ref 6.0–8.5)
eGFR: 96 mL/min/1.73

## 2024-02-16 ENCOUNTER — Ambulatory Visit (HOSPITAL_COMMUNITY): Payer: Self-pay | Admitting: Physician Assistant

## 2024-02-17 ENCOUNTER — Other Ambulatory Visit

## 2024-02-19 ENCOUNTER — Telehealth: Payer: Self-pay | Admitting: Rehabilitative and Restorative Service Providers"

## 2024-02-19 ENCOUNTER — Ambulatory Visit: Payer: Self-pay

## 2024-02-19 ENCOUNTER — Other Ambulatory Visit

## 2024-02-19 ENCOUNTER — Ambulatory Visit: Admitting: Rehabilitative and Restorative Service Providers"

## 2024-02-19 NOTE — Telephone Encounter (Signed)
 Called patient secondary to missed visit on 02/19/24.  She admits that she forgot about her appointment.  Offered a later time slot, but patient states that she has her 3 children with her and did not arrange child care.  Confirmed next schedule patient.

## 2024-02-21 ENCOUNTER — Ambulatory Visit: Admitting: Rehabilitative and Restorative Service Providers"

## 2024-02-21 ENCOUNTER — Telehealth: Payer: Self-pay | Admitting: Rehabilitative and Restorative Service Providers"

## 2024-02-21 NOTE — Telephone Encounter (Signed)
 Called patient regarding missed visit/no show on 02/21/2024.  Left message stating that due to this being patient's 2nd missed visit in a row, that our office would cancel all but the visits next week, and she would only be able to schedule one visit at a time moving forward.  Requested that she call in if she needs to reschedule.

## 2024-02-24 ENCOUNTER — Ambulatory Visit: Admitting: Rehabilitative and Restorative Service Providers"

## 2024-02-26 ENCOUNTER — Ambulatory Visit

## 2024-03-04 ENCOUNTER — Ambulatory Visit: Admitting: Rehabilitative and Restorative Service Providers"

## 2024-03-06 ENCOUNTER — Ambulatory Visit: Admitting: Rehabilitative and Restorative Service Providers"

## 2024-03-09 ENCOUNTER — Ambulatory Visit: Admitting: Rehabilitative and Restorative Service Providers"

## 2024-03-11 ENCOUNTER — Ambulatory Visit: Admitting: Rehabilitative and Restorative Service Providers"

## 2024-03-16 ENCOUNTER — Ambulatory Visit: Admitting: Rehabilitative and Restorative Service Providers"

## 2024-03-18 ENCOUNTER — Ambulatory Visit: Admitting: Rehabilitative and Restorative Service Providers"

## 2024-03-23 ENCOUNTER — Ambulatory Visit: Admitting: Rehabilitative and Restorative Service Providers"

## 2024-03-25 ENCOUNTER — Ambulatory Visit: Admitting: Rehabilitative and Restorative Service Providers"

## 2024-03-30 ENCOUNTER — Ambulatory Visit: Admitting: Rehabilitative and Restorative Service Providers"

## 2024-04-01 ENCOUNTER — Ambulatory Visit: Admitting: Rehabilitative and Restorative Service Providers"

## 2024-04-06 ENCOUNTER — Ambulatory Visit: Admitting: Rehabilitative and Restorative Service Providers"

## 2024-04-08 ENCOUNTER — Ambulatory Visit: Admitting: Rehabilitative and Restorative Service Providers"
